# Patient Record
Sex: Male | Born: 1965 | Race: Black or African American | Hispanic: No | Marital: Married | Smoking: Current every day smoker
Health system: Southern US, Community
[De-identification: ages and names within clinical notes are randomized; demographics above are authoritative.]

## PROBLEM LIST (undated history)

## (undated) DIAGNOSIS — E119 Type 2 diabetes mellitus without complications: Secondary | ICD-10-CM

## (undated) DIAGNOSIS — I671 Cerebral aneurysm, nonruptured: Secondary | ICD-10-CM

## (undated) DIAGNOSIS — I10 Essential (primary) hypertension: Secondary | ICD-10-CM

## (undated) DIAGNOSIS — I2510 Atherosclerotic heart disease of native coronary artery without angina pectoris: Secondary | ICD-10-CM

## (undated) DIAGNOSIS — E785 Hyperlipidemia, unspecified: Secondary | ICD-10-CM

## (undated) DIAGNOSIS — N189 Chronic kidney disease, unspecified: Secondary | ICD-10-CM

## (undated) DIAGNOSIS — I219 Acute myocardial infarction, unspecified: Secondary | ICD-10-CM

## (undated) HISTORY — PX: CORONARY ANGIOPLASTY: SHX604

## (undated) HISTORY — PX: SHOULDER SURGERY: SHX246

---

## 2007-02-07 ENCOUNTER — Emergency Department: Payer: Self-pay | Admitting: Emergency Medicine

## 2009-04-03 ENCOUNTER — Emergency Department: Payer: Self-pay | Admitting: Emergency Medicine

## 2015-09-24 ENCOUNTER — Encounter: Payer: Self-pay | Admitting: Emergency Medicine

## 2015-09-24 ENCOUNTER — Inpatient Hospital Stay
Admission: EM | Admit: 2015-09-24 | Discharge: 2015-09-25 | DRG: 249 | Disposition: A | Discharge status: Home / Self Care | Payer: Medicaid Other | Attending: Internal Medicine | Admitting: Internal Medicine

## 2015-09-24 ENCOUNTER — Inpatient Hospital Stay
Admit: 2015-09-24 | Discharge: 2015-09-24 | Disposition: A | Discharge status: Home / Self Care | Payer: Medicaid Other | Attending: Cardiovascular Disease | Admitting: Cardiovascular Disease

## 2015-09-24 ENCOUNTER — Encounter
Admission: EM | Disposition: A | Discharge status: Home / Self Care | Payer: Self-pay | Source: Home / Self Care | Attending: Internal Medicine

## 2015-09-24 ENCOUNTER — Emergency Department: Payer: Medicaid Other

## 2015-09-24 DIAGNOSIS — Z833 Family history of diabetes mellitus: Secondary | ICD-10-CM

## 2015-09-24 DIAGNOSIS — F17210 Nicotine dependence, cigarettes, uncomplicated: Secondary | ICD-10-CM | POA: Diagnosis present

## 2015-09-24 DIAGNOSIS — I10 Essential (primary) hypertension: Secondary | ICD-10-CM | POA: Diagnosis present

## 2015-09-24 DIAGNOSIS — I214 Non-ST elevation (NSTEMI) myocardial infarction: Secondary | ICD-10-CM | POA: Diagnosis not present

## 2015-09-24 DIAGNOSIS — R7989 Other specified abnormal findings of blood chemistry: Secondary | ICD-10-CM

## 2015-09-24 DIAGNOSIS — R778 Other specified abnormalities of plasma proteins: Secondary | ICD-10-CM

## 2015-09-24 HISTORY — PX: CARDIAC CATHETERIZATION: SHX172

## 2015-09-24 HISTORY — DX: Cerebral aneurysm, nonruptured: I67.1

## 2015-09-24 LAB — HEPATIC FUNCTION PANEL
ALT: 27 U/L (ref 17–63)
AST: 75 U/L — AB (ref 15–41)
Albumin: 4.2 g/dL (ref 3.5–5.0)
Alkaline Phosphatase: 52 U/L (ref 38–126)
Bilirubin, Direct: <0.1 mg/dL — ABNORMAL LOW (ref 0.1–0.5)
TOTAL PROTEIN: 8.4 g/dL — AB (ref 6.5–8.1)
Total Bilirubin: 0.6 mg/dL (ref 0.3–1.2)

## 2015-09-24 LAB — URINE DRUG SCREEN, QUALITATIVE (ARMC ONLY)
Amphetamines, Ur Screen: NOT DETECTED
Barbiturates, Ur Screen: NOT DETECTED
Benzodiazepine, Ur Scrn: POSITIVE — AB
Cannabinoid 50 Ng, Ur ~~LOC~~: POSITIVE — AB
Cocaine Metabolite,Ur ~~LOC~~: POSITIVE — AB
MDMA (Ecstasy)Ur Screen: NOT DETECTED
Methadone Scn, Ur: NOT DETECTED
Opiate, Ur Screen: POSITIVE — AB
Phencyclidine (PCP) Ur S: NOT DETECTED
Tricyclic, Ur Screen: NOT DETECTED

## 2015-09-24 LAB — CBC
HCT: 43 % (ref 40.0–52.0)
HEMOGLOBIN: 14.5 g/dL (ref 13.0–18.0)
MCH: 29.8 pg (ref 26.0–34.0)
MCHC: 33.8 g/dL (ref 32.0–36.0)
MCV: 88.3 fL (ref 80.0–100.0)
Platelets: 312 10*3/uL (ref 150–440)
RBC: 4.87 MIL/uL (ref 4.40–5.90)
RDW: 14.1 % (ref 11.5–14.5)
WBC: 11.1 10*3/uL — ABNORMAL HIGH (ref 3.8–10.6)

## 2015-09-24 LAB — BASIC METABOLIC PANEL
ANION GAP: 9 (ref 5–15)
BUN: 13 mg/dL (ref 6–20)
CALCIUM: 9.5 mg/dL (ref 8.9–10.3)
CO2: 24 mmol/L (ref 22–32)
Chloride: 101 mmol/L (ref 101–111)
Creatinine, Ser: 1.25 mg/dL — ABNORMAL HIGH (ref 0.61–1.24)
Glucose, Bld: 215 mg/dL — ABNORMAL HIGH (ref 65–99)
Potassium: 3.9 mmol/L (ref 3.5–5.1)
Sodium: 134 mmol/L — ABNORMAL LOW (ref 135–145)

## 2015-09-24 LAB — CK: Total CK: 842 U/L — ABNORMAL HIGH (ref 49–397)

## 2015-09-24 LAB — HEMOGLOBIN A1C: Hgb A1c MFr Bld: 9.2 % — ABNORMAL HIGH (ref 4.0–6.0)

## 2015-09-24 LAB — ECHOCARDIOGRAM COMPLETE
HEIGHTINCHES: 66 in
WEIGHTICAEL: 2974.4 [oz_av]

## 2015-09-24 LAB — APTT: aPTT: 30 seconds (ref 24–36)

## 2015-09-24 LAB — PROTIME-INR
INR: 1.11
Prothrombin Time: 14.5 seconds (ref 11.4–15.0)

## 2015-09-24 LAB — TROPONIN I: TROPONIN I: 4.75 ng/mL — AB (ref <0.031)

## 2015-09-24 LAB — LIPASE, BLOOD: Lipase: 29 U/L (ref 11–51)

## 2015-09-24 LAB — TSH: TSH: 2.712 u[IU]/mL (ref 0.350–4.500)

## 2015-09-24 SURGERY — LEFT HEART CATH AND CORONARY ANGIOGRAPHY
Anesthesia: Moderate Sedation | Laterality: Right

## 2015-09-24 MED ORDER — SODIUM CHLORIDE 0.9 % IV SOLN
0.2500 mg/kg/h | INTRAVENOUS | Status: AC
  Filled 2015-09-24: qty 250

## 2015-09-24 MED ORDER — ACETAMINOPHEN 325 MG PO TABS: 650.0000 mg | ORAL_TABLET | Freq: Four times a day (QID) | ORAL | Status: DC | PRN

## 2015-09-24 MED ORDER — HEPARIN (PORCINE) IN NACL 2-0.9 UNIT/ML-% IJ SOLN
INTRAMUSCULAR | Status: AC
  Filled 2015-09-24: qty 1000

## 2015-09-24 MED ORDER — HEPARIN (PORCINE) IN NACL 100-0.45 UNIT/ML-% IJ SOLN
1000.0000 [IU]/h | INTRAMUSCULAR | Status: DC
  Administered 2015-09-24: 1000 [IU]/h via INTRAVENOUS
  Filled 2015-09-24: qty 250

## 2015-09-24 MED ORDER — MIDAZOLAM HCL 2 MG/2ML IJ SOLN
INTRAMUSCULAR | Status: AC
  Filled 2015-09-24: qty 2

## 2015-09-24 MED ORDER — ONDANSETRON HCL 4 MG/2ML IJ SOLN
4.0000 mg | Freq: Once | INTRAMUSCULAR | Status: AC
  Administered 2015-09-24: 4 mg via INTRAVENOUS
  Filled 2015-09-24: qty 2

## 2015-09-24 MED ORDER — ASPIRIN 81 MG PO CHEW
CHEWABLE_TABLET | ORAL | Status: DC | PRN
  Administered 2015-09-24: 324 mg via ORAL

## 2015-09-24 MED ORDER — MORPHINE SULFATE (PF) 4 MG/ML IV SOLN
4.0000 mg | Freq: Once | INTRAVENOUS | Status: AC
  Administered 2015-09-24: 4 mg via INTRAVENOUS
  Filled 2015-09-24: qty 1

## 2015-09-24 MED ORDER — BIVALIRUDIN 250 MG IV SOLR
INTRAVENOUS | Status: AC
  Filled 2015-09-24: qty 250

## 2015-09-24 MED ORDER — ACETAMINOPHEN 650 MG RE SUPP: 650.0000 mg | Freq: Four times a day (QID) | RECTAL | Status: DC | PRN

## 2015-09-24 MED ORDER — ONDANSETRON HCL 4 MG/2ML IJ SOLN: 4.0000 mg | Freq: Four times a day (QID) | INTRAMUSCULAR | Status: DC | PRN

## 2015-09-24 MED ORDER — BIVALIRUDIN BOLUS VIA INFUSION - CUPID
INTRAVENOUS | Status: DC | PRN
  Administered 2015-09-24: 63.225 mg via INTRAVENOUS

## 2015-09-24 MED ORDER — CLINDAMYCIN PHOSPHATE 300 MG/50ML IV SOLN
INTRAVENOUS | Status: AC
  Filled 2015-09-24: qty 50

## 2015-09-24 MED ORDER — DOCUSATE SODIUM 100 MG PO CAPS
100.0000 mg | ORAL_CAPSULE | Freq: Two times a day (BID) | ORAL | Status: DC
  Administered 2015-09-25: 100 mg via ORAL
  Filled 2015-09-24 (×2): qty 1

## 2015-09-24 MED ORDER — MIDAZOLAM HCL 2 MG/2ML IJ SOLN
INTRAMUSCULAR | Status: DC | PRN
  Administered 2015-09-24: 1 mg via INTRAVENOUS

## 2015-09-24 MED ORDER — TICAGRELOR 90 MG PO TABS
ORAL_TABLET | ORAL | Status: DC | PRN
  Administered 2015-09-24: 180 mg via ORAL

## 2015-09-24 MED ORDER — FENTANYL CITRATE (PF) 100 MCG/2ML IJ SOLN
INTRAMUSCULAR | Status: DC | PRN
  Administered 2015-09-24: 25 ug via INTRAVENOUS

## 2015-09-24 MED ORDER — IOPAMIDOL (ISOVUE-300) INJECTION 61%
INTRAVENOUS | Status: DC | PRN
  Administered 2015-09-24: 240 mL via INTRA_ARTERIAL

## 2015-09-24 MED ORDER — BIVALIRUDIN 250 MG IV SOLR
250.0000 mg | INTRAVENOUS | Status: DC | PRN
  Administered 2015-09-24: 1.75 mg/kg/h via INTRAVENOUS

## 2015-09-24 MED ORDER — HEPARIN SODIUM (PORCINE) 5000 UNIT/ML IJ SOLN
60.0000 [IU]/kg | Freq: Once | INTRAMUSCULAR | Status: DC
  Filled 2015-09-24: qty 2

## 2015-09-24 MED ORDER — HEPARIN (PORCINE) IN NACL 100-0.45 UNIT/ML-% IJ SOLN: 14.0000 [IU]/kg/h | Freq: Once | INTRAMUSCULAR | Status: DC

## 2015-09-24 MED ORDER — ASPIRIN 81 MG PO CHEW
CHEWABLE_TABLET | ORAL | Status: AC
  Filled 2015-09-24: qty 4

## 2015-09-24 MED ORDER — ASPIRIN EC 81 MG PO TBEC
81.0000 mg | DELAYED_RELEASE_TABLET | Freq: Every day | ORAL | Status: DC
  Administered 2015-09-25: 81 mg via ORAL
  Filled 2015-09-24: qty 1

## 2015-09-24 MED ORDER — ONDANSETRON HCL 4 MG PO TABS: 4.0000 mg | ORAL_TABLET | Freq: Four times a day (QID) | ORAL | Status: DC | PRN

## 2015-09-24 MED ORDER — TICAGRELOR 90 MG PO TABS
ORAL_TABLET | ORAL | Status: AC
  Filled 2015-09-24: qty 2

## 2015-09-24 MED ORDER — FENTANYL CITRATE (PF) 100 MCG/2ML IJ SOLN
INTRAMUSCULAR | Status: DC | PRN
  Administered 2015-09-24: 50 ug via INTRAVENOUS

## 2015-09-24 MED ORDER — FENTANYL CITRATE (PF) 100 MCG/2ML IJ SOLN
INTRAMUSCULAR | Status: AC
  Filled 2015-09-24: qty 2

## 2015-09-24 MED ORDER — OXYCODONE HCL 5 MG PO TABS
5.0000 mg | ORAL_TABLET | ORAL | Status: DC | PRN
  Administered 2015-09-24 – 2015-09-25 (×3): 5 mg via ORAL
  Filled 2015-09-24 (×3): qty 1

## 2015-09-24 MED ORDER — ASPIRIN 81 MG PO CHEW
324.0000 mg | CHEWABLE_TABLET | Freq: Once | ORAL | Status: AC
  Administered 2015-09-24: 324 mg via ORAL
  Filled 2015-09-24: qty 4

## 2015-09-24 MED ORDER — MIDAZOLAM HCL 2 MG/2ML IJ SOLN
INTRAMUSCULAR | Status: DC | PRN
  Administered 2015-09-24: 0.5 mg via INTRAVENOUS

## 2015-09-24 MED ORDER — SODIUM CHLORIDE 0.9% FLUSH
3.0000 mL | Freq: Two times a day (BID) | INTRAVENOUS | Status: DC
  Administered 2015-09-24: 3 mL via INTRAVENOUS

## 2015-09-24 MED ORDER — MORPHINE SULFATE (PF) 2 MG/ML IV SOLN
2.0000 mg | INTRAVENOUS | Status: DC | PRN
  Administered 2015-09-24: 2 mg via INTRAVENOUS
  Filled 2015-09-24: qty 1

## 2015-09-24 MED ORDER — NITROGLYCERIN 2 % TD OINT
1.0000 [in_us] | TOPICAL_OINTMENT | Freq: Once | TRANSDERMAL | Status: AC
  Administered 2015-09-24: 1 [in_us] via TOPICAL
  Filled 2015-09-24: qty 1

## 2015-09-24 MED ORDER — HEPARIN BOLUS VIA INFUSION
4000.0000 [IU] | Freq: Once | INTRAVENOUS | Status: AC
  Administered 2015-09-24: 4000 [IU] via INTRAVENOUS
  Filled 2015-09-24: qty 4000

## 2015-09-24 MED ORDER — ATORVASTATIN CALCIUM 20 MG PO TABS
80.0000 mg | ORAL_TABLET | Freq: Every day | ORAL | Status: DC
  Administered 2015-09-24: 80 mg via ORAL
  Filled 2015-09-24: qty 4

## 2015-09-24 MED ORDER — SODIUM CHLORIDE 0.9 % IV SOLN
INTRAVENOUS | Status: DC
  Administered 2015-09-24 – 2015-09-25 (×4): via INTRAVENOUS

## 2015-09-24 MED ORDER — NITROGLYCERIN 5 MG/ML IV SOLN
INTRAVENOUS | Status: AC
  Filled 2015-09-24: qty 10

## 2015-09-24 SURGICAL SUPPLY — 16 items
BALLN TREK RX 2.5X15 (BALLOONS) ×3
BALLOON TREK RX 2.5X15 (BALLOONS) ×2 IMPLANT
CATH INFINITI 5FR ANG PIGTAIL (CATHETERS) ×3 IMPLANT
CATH INFINITI 5FR JL4 (CATHETERS) ×3 IMPLANT
CATH INFINITI JR4 5F (CATHETERS) ×3 IMPLANT
CATH VISTA GUIDE 6FR JR4 (CATHETERS) ×3 IMPLANT
DEVICE CLOSURE MYNXGRIP 6/7F (Vascular Products) ×3 IMPLANT
DEVICE INFLAT 30 PLUS (MISCELLANEOUS) ×3 IMPLANT
KIT MANI 3VAL PERCEP (MISCELLANEOUS) ×3 IMPLANT
NEEDLE PERC 18GX7CM (NEEDLE) ×3 IMPLANT
PACK CARDIAC CATH (CUSTOM PROCEDURE TRAY) ×3 IMPLANT
SHEATH AVANTI 6FR X 11CM (SHEATH) ×3 IMPLANT
SHEATH PINNACLE 5F 10CM (SHEATH) ×3 IMPLANT
STENT VISION RX 2.75X18 (Permanent Stent) ×3 IMPLANT
WIRE EMERALD 3MM-J .035X150CM (WIRE) ×3 IMPLANT
WIRE G HI TQ BMW 190 (WIRE) ×3 IMPLANT

## 2015-09-24 NOTE — ED Notes (Signed)
Dr Dolores FrameSung notified of pt's troponin results

## 2015-09-24 NOTE — ED Notes (Signed)
Dr. Diamond in to see pt.  

## 2015-09-24 NOTE — H&P (Signed)
Tyler Morales is an 50 y.o. male.   Chief Complaint: Chest pain HPI: The patient with no past medical history presents emergency department with chest pain. He states that it began almost 24 hours ago and has been constant. He admits to associated shortness of breath and nausea. The pain is located under his left breast. It does not radiate. He has never had pain like this before. He admits to smoking an occasional social drinking. He also admits to cocaine use 1 month ago. In the emergency department, the patient's blood pressure was elevated and Nitropaste was applied to his chest. Troponin was found to be elevated and he was started on a heparin drip before being admitted to the hospitalist service for further evaluation.   Past Medical History  Diagnosis Date  . Cerebral aneurysm without rupture     Past Surgical History  Procedure Laterality Date  . Shoulder surgery Left     Family History  Problem Relation Age of Onset  . Diabetes Mellitus II     Social History:  reports that he has been smoking Cigarettes.  He has been smoking about 0.50 packs per day. He does not have any smokeless tobacco history on file. He reports that he does not drink alcohol or use illicit drugs.  Allergies: No Known Allergies  Prior to Admission medications   Not on File   None  Results for orders placed or performed during the hospital encounter of 09/24/15 (from the past 48 hour(s))  Basic metabolic panel     Status: Abnormal   Collection Time: 09/24/15  3:50 AM  Result Value Ref Range   Sodium 134 (L) 135 - 145 mmol/L   Potassium 3.9 3.5 - 5.1 mmol/L   Chloride 101 101 - 111 mmol/L   CO2 24 22 - 32 mmol/L   Glucose, Bld 215 (H) 65 - 99 mg/dL   BUN 13 6 - 20 mg/dL   Creatinine, Ser 1.25 (H) 0.61 - 1.24 mg/dL   Calcium 9.5 8.9 - 10.3 mg/dL   GFR calc non Af Amer >60 >60 mL/min   GFR calc Af Amer >60 >60 mL/min    Comment: (NOTE) The eGFR has been calculated using the CKD EPI  equation. This calculation has not been validated in all clinical situations. eGFR's persistently <60 mL/min signify possible Chronic Kidney Disease.    Anion gap 9 5 - 15  CBC     Status: Abnormal   Collection Time: 09/24/15  3:50 AM  Result Value Ref Range   WBC 11.1 (H) 3.8 - 10.6 K/uL   RBC 4.87 4.40 - 5.90 MIL/uL   Hemoglobin 14.5 13.0 - 18.0 g/dL   HCT 43.0 40.0 - 52.0 %   MCV 88.3 80.0 - 100.0 fL   MCH 29.8 26.0 - 34.0 pg   MCHC 33.8 32.0 - 36.0 g/dL   RDW 14.1 11.5 - 14.5 %   Platelets 312 150 - 440 K/uL  Troponin I     Status: Abnormal   Collection Time: 09/24/15  3:50 AM  Result Value Ref Range   Troponin I 4.75 (H) <0.031 ng/mL    Comment: READ BACK AND VERIFIED WITH LISA THOMPSON @ [phone] ON 09/24/2015 BY [facility]        POSSIBLE MYOCARDIAL ISCHEMIA. SERIAL TESTING RECOMMENDED.   Hepatic function panel     Status: Abnormal   Collection Time: 09/24/15  3:50 AM  Result Value Ref Range   Total Protein 8.4 (H) 6.5 - 8.1 g/dL  Albumin 4.2 3.5 - 5.0 g/dL   AST 75 (H) 15 - 41 U/L   ALT 27 17 - 63 U/L   Alkaline Phosphatase 52 38 - 126 U/L   Total Bilirubin 0.6 0.3 - 1.2 mg/dL   Bilirubin, Direct <0.1 (L) 0.1 - 0.5 mg/dL   Indirect Bilirubin NOT CALCULATED 0.3 - 0.9 mg/dL  Lipase, blood     Status: None   Collection Time: 09/24/15  3:50 AM  Result Value Ref Range   Lipase 29 11 - 51 U/L  CK     Status: Abnormal   Collection Time: 09/24/15  3:50 AM  Result Value Ref Range   Total CK 842 (H) 49 - 397 U/L   Dg Chest Portable 1 View  09/24/2015  CLINICAL DATA:  Chest pain, non radiating EXAM: PORTABLE CHEST 1 VIEW COMPARISON:  None available FINDINGS: Normal heart size and mediastinal contours. No infiltrate or edema. No effusion or pneumothorax. No acute osseous findings. IMPRESSION: Negative portable chest. Electronically Signed   By: Monte Fantasia M.D.   On: 09/24/2015 04:11    Review of Systems  Constitutional: Negative for fever and chills.  HENT: Negative for  sore throat and tinnitus.   Eyes: Negative for blurred vision and redness.  Respiratory: Positive for shortness of breath. Negative for cough.   Cardiovascular: Positive for chest pain. Negative for palpitations, orthopnea and PND.  Gastrointestinal: Positive for nausea. Negative for vomiting, abdominal pain and diarrhea.  Genitourinary: Negative for dysuria, urgency and frequency.  Musculoskeletal: Negative for myalgias and joint pain.  Skin: Negative for rash.       No lesions  Neurological: Negative for speech change, focal weakness and weakness.  Endo/Heme/Allergies: Does not bruise/bleed easily.       No temperature intolerance  Psychiatric/Behavioral: Negative for depression and suicidal ideas.    Blood pressure 154/98, pulse 77, temperature 98.6 F (37 C), temperature source Oral, resp. rate 19, height '5\' 6"'$  (1.676 m), weight 89.812 kg (198 lb), SpO2 96 %. Physical Exam  Nursing note and vitals reviewed. Constitutional: He is oriented to person, place, and time. He appears well-developed and well-nourished. No distress.  HENT:  Head: Normocephalic and atraumatic.  Mouth/Throat: Oropharynx is clear and moist.  Eyes: Conjunctivae and EOM are normal. Pupils are equal, round, and reactive to light. No scleral icterus.  Neck: Normal range of motion. Neck supple. No JVD present. No tracheal deviation present. No thyromegaly present.  Cardiovascular: Normal rate, regular rhythm and normal heart sounds.  Exam reveals no gallop and no friction rub.   No murmur heard. Respiratory: Effort normal and breath sounds normal. No respiratory distress. He has no wheezes.  GI: Soft. Bowel sounds are normal. He exhibits no distension. There is no tenderness.  Genitourinary:  Deferred  Musculoskeletal: Normal range of motion. He exhibits no edema.  Lymphadenopathy:    He has no cervical adenopathy.  Neurological: He is alert and oriented to person, place, and time. No cranial nerve deficit.   Skin: Skin is warm and dry. No rash noted. No erythema.  Psychiatric: He has a normal mood and affect. His behavior is normal. Judgment and thought content normal.     Assessment/Plan This is a 50 year old male admitted for NSTEMI. 1. NSTEMI: Ongoing chest pain and elevated troponin. EKG without signs of ischemia. Possible pain ingestion. Urine toxicology pending. We'll avoid beta blockers for now. Optimize blood pressure to decrease myocardial oxygen demand. Heparin drip. Continue to monitor telemetry and cycle cardiac enzymes. Cardiology  consulted. 2. DVT prophylaxis: Therapeutic anticoagulation as above 3. GI prophylaxis: H2 blocker as needed. The patient is a full code. Time spent on admission orders and patient care approximately 45 minutes  Harrie Foreman, MD 09/24/2015, 5:39 AM

## 2015-09-24 NOTE — Progress Notes (Signed)
ANTICOAGULATION CONSULT NOTE - Initial Consult  Pharmacy Consult for heparin Indication: chest pain/ACS  No Known Allergies  Patient Measurements: Height: 5\' 6"  (167.6 cm) Weight: 198 lb (89.812 kg) IBW/kg (Calculated) : 63.8 Heparin Dosing Weight: 82.8 kg  Vital Signs: Temp: 98.6 F (37 C) (06/06 0342) Temp Source: Oral (06/06 0342) BP: 159/113 mmHg (06/06 0400) Pulse Rate: 72 (06/06 0342)  Labs:  Recent Labs  09/24/15 0350  HGB 14.5  HCT 43.0  PLT 312    CrCl cannot be calculated (Patient has no serum creatinine result on file.).   Medical History: History reviewed. No pertinent past medical history.  Medications:  Infusions:  . heparin      Assessment: 50 yom cc CP nonradiating, with nausea. History of cocaine use. Pharmacy consulted to dose heparin for ACS.  Goal of Therapy:  Heparin level 0.3-0.7 units/ml Monitor platelets by anticoagulation protocol: Yes   Plan:  Give 4000 units bolus x 1 Start heparin infusion at 1000 units/hr Check anti-Xa level in 6 hours and daily while on heparin Continue to monitor H&H and platelets  Carola FrostNathan A Eulon Allnutt, Pharm.D., BCPS Clinical Pharmacist 09/24/2015,4:34 AM

## 2015-09-24 NOTE — ED Notes (Addendum)
Pt ambulatory to room 8 without difficulty or distress noted; pt c/o left upper CP, nonradiating accomp by nausea since yesterday; denies hx of same; resp even/unlab, lungs clear, +BS, abd soft/nondist/nontender; apical audible & regular; pt placed in hosp gown and on card monitor

## 2015-09-24 NOTE — ED Notes (Signed)
PCXR completed as ordered 

## 2015-09-24 NOTE — Progress Notes (Signed)
Per verbal order from Dr. Welton FlakesKhan d/c heparin drip. Orders placed. Trudee KusterBrandi R Mansfield

## 2015-09-24 NOTE — Progress Notes (Signed)
Report to brandy telemetry  Check right groin for bleeding or hematoma.  Patient will be on bedrest for 2 hours post sheath pull---out of bed at 14:40.  Bilateral pulses are 2's DP's..Marland Kitchen

## 2015-09-24 NOTE — Progress Notes (Signed)
Inpatient Diabetes Program Recommendations  AACE/ADA: New Consensus Statement on Inpatient Glycemic Control (2015)  Target Ranges:  Prepandial:   less than 140 mg/dL      Peak postprandial:   less than 180 mg/dL (1-2 hours)      Critically ill patients:  140 - 180 mg/dL     Review of Glycemic Control Results for [name], Tyler Morales (MRN [id number]) as of 09/24/2015 12:39  Ref. Range 09/24/2015 03:50  Glucose Latest Ref Range: 65-99 mg/dL 841215 (H)    Diabetes history: None noted Outpatient Diabetes medications: None  Inpatient Diabetes Program Recommendations:    A1C pending. Consider checking blood sugars tid with meals and HS with Novolog correction while patient is in the hospital.  Will follow.  Thanks, Beryl MeagerJenny Alquan Morrish, RN, BC-ADM Inpatient Diabetes Coordinator Pager [phone] (8a-5p)

## 2015-09-24 NOTE — Progress Notes (Signed)
[facility] [place] [facility]   PATIENT NAME: Tyler ChiquitoKeith Morales    MRN#:  [id number]  DATE OF BIRTH:  [birth year]  SUBJECTIVE:  Hospital Day: 0 days Tyler Morales is a 50 y.o. male presenting with Chest Pain .   Overnight events: No overnight events Interval Events: Continued intermittent chest pain though relieved with pain medication  REVIEW OF SYSTEMS:  CONSTITUTIONAL: No fever, fatigue or weakness.  EYES: No blurred or double vision.  EARS, NOSE, AND THROAT: No tinnitus or ear pain.  RESPIRATORY: No cough, shortness of breath, wheezing or hemoptysis.  CARDIOVASCULAR: Positive chest pain, denies orthopnea, edema.  GASTROINTESTINAL: No nausea, vomiting, diarrhea or abdominal pain.  GENITOURINARY: No dysuria, hematuria.  ENDOCRINE: No polyuria, nocturia,  HEMATOLOGY: No anemia, easy bruising or bleeding SKIN: No rash or lesion. MUSCULOSKELETAL: No joint pain or arthritis.   NEUROLOGIC: No tingling, numbness, weakness.  PSYCHIATRY: No anxiety or depression.   DRUG ALLERGIES:  No Known Allergies  VITALS:  Blood pressure 150/105, pulse 89, temperature 98.2 F (36.8 C), temperature source Oral, resp. rate 20, height 5\' 6"  (1.676 m), weight 185 lb 14.4 oz (84.324 kg), SpO2 99 %.  PHYSICAL EXAMINATION:  VITAL SIGNS: Filed Vitals:   09/24/15 1110 09/24/15 1245  BP: 147/108 150/105  Pulse: 103 89  Temp: 98.2 F (36.8 C)   Resp: 18 20   GENERAL:50 y.o.male currently in no acute distress.  HEAD: Normocephalic, atraumatic.  EYES: Pupils equal, round, reactive to light. Extraocular muscles intact. No scleral icterus.  MOUTH: Moist mucosal membrane. Dentition intact. No abscess noted.  EAR, NOSE, THROAT: Clear without exudates. No external lesions.  NECK: Supple. No thyromegaly. No nodules. No JVD.  PULMONARY: Clear to ascultation, without wheeze rails or rhonci. No use of accessory muscles, Good respiratory effort. good air entry  bilaterally CHEST: Nontender to palpation.  CARDIOVASCULAR: S1 and S2. Regular rate and rhythm. No murmurs, rubs, or gallops. No edema. Pedal pulses 2+ bilaterally.  GASTROINTESTINAL: Soft, nontender, nondistended. No masses. Positive bowel sounds. No hepatosplenomegaly.  MUSCULOSKELETAL: No swelling, clubbing, or edema. Range of motion full in all extremities.  NEUROLOGIC: Cranial nerves II through XII are intact. No gross focal neurological deficits. Sensation intact. Reflexes intact.  SKIN: No ulceration, lesions, rashes, or cyanosis. Skin warm and dry. Turgor intact.  PSYCHIATRIC: Mood, affect within normal limits. The patient is awake, alert and oriented x 3. Insight, judgment intact.      LABORATORY PANEL:   CBC  Recent Labs Lab 09/24/15 0350  WBC 11.1*  HGB 14.5  HCT 43.0  PLT 312   ------------------------------------------------------------------------------------------------------------------  Chemistries   Recent Labs Lab 09/24/15 0350  NA 134*  K 3.9  CL 101  CO2 24  GLUCOSE 215*  BUN 13  CREATININE 1.25*  CALCIUM 9.5  AST 75*  ALT 27  ALKPHOS 52  BILITOT 0.6   ------------------------------------------------------------------------------------------------------------------  Cardiac Enzymes  Recent Labs Lab 09/24/15 0350  TROPONINI 4.75*   ------------------------------------------------------------------------------------------------------------------  RADIOLOGY:  Dg Chest Portable 1 View  09/24/2015  CLINICAL DATA:  Chest pain, non radiating EXAM: PORTABLE CHEST 1 VIEW COMPARISON:  None available FINDINGS: Normal heart size and mediastinal contours. No infiltrate or edema. No effusion or pneumothorax. No acute osseous findings. IMPRESSION: Negative portable chest. Electronically Signed   By: Marnee SpringJonathon  Watts M.D.   On: 09/24/2015 04:11    EKG:   Orders placed or performed during the hospital encounter of 09/24/15  . ED EKG within 10 minutes   .  ED EKG within 10 minutes  . EKG 12-Lead  . EKG 12-Lead    ASSESSMENT AND PLAN:   Tyler Morales is a 50 y.o. male presenting with Chest Pain . Admitted 09/24/2015 : Day #: 0 days 1. NSTEMI: Aspirin, statin, heparin, cardiology consult, telemetry, cardiac enzymes, echocardiogram, patient for cardiac catheterization    All the records are reviewed and case discussed with Care Management/Social Workerr. Management plans discussed with the patient, family and they are in agreement.  CODE STATUS: full TOTAL TIME TAKING CARE OF THIS PATIENT: 28 minutes.   POSSIBLE D/C IN 1-2DAYS, DEPENDING ON CLINICAL CONDITION.   Hower,  Tyler Morales.D on 09/24/2015 at 1:00 PM  Between 7am to 6pm - Pager - [phone]  After 6pm: House Pager: - [phone]  [facility]  Office  [phone]  CC: Primary care physician; No PCP Per Patient

## 2015-09-24 NOTE — Progress Notes (Signed)
Patient back from cath, all vss. R groin dressing clean, dry, and intact. No signs of hematoma. Positive pulses. Trudee KusterBrandi R Mansfield

## 2015-09-24 NOTE — Progress Notes (Signed)
Tyler Morales is Morales 50 y.o. 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 Chest pain is described as sharp associated with shortness of breath and dizziness.   Review of Systems: No orthopnea PND or leg swelling   Past Medical History  Diagnosis Date  . Cerebral aneurysm without rupture     No prescriptions prior to admission     . docusate sodium  100 mg Oral BID  . sodium chloride flush  3 mL Intravenous Q12H    Infusions: . sodium chloride 125 mL/hr at 09/24/15 0639  . heparin 1,000 Units/hr (09/24/15 0502)    No Known Allergies  Social History   Social History  . Marital Status: Single    Spouse Name: N/Morales  . Number of Children: N/Morales  . Years of Education: N/Morales   Occupational History  . Not on file.   Social History Main Topics  . Smoking status: Current Every Day Smoker -- 0.50 packs/day    Types: Cigarettes  . Smokeless tobacco: Not on file  . Alcohol Use: No     Comment: weekly  . Drug Use: No  . Sexual Activity: Not on file   Other Topics Concern  . Not on file   Social History Narrative  . No narrative on file    Family History  Problem Relation Age of Onset  . Diabetes Mellitus II      PHYSICAL EXAM: Filed Vitals:   09/24/15 0600 09/24/15 0627  BP: 154/85 152/96  Pulse: 91 83  Temp:  98 F (36.7 C)  Resp: 23 19    No intake or output data in the 24 hours ending 09/24/15 0849  General:  Well appearing. No respiratory difficulty HEENT: normal Neck: supple. no JVD. Carotids 2+ bilat; no bruits. No lymphadenopathy or thryomegaly appreciated. Cor: PMI nondisplaced. Regular rate &  rhythm. No rubs, gallops or murmurs. Lungs: clear Abdomen: soft, nontender, nondistended. No hepatosplenomegaly. No bruits or masses. Good bowel sounds. Extremities: no cyanosis, clubbing, rash, edema Neuro: alert & oriented x 3, cranial nerves grossly intact. moves all 4 extremities w/o difficulty. Affect pleasant.  WUJ:WJXBJECG:Sinus rhythm no ST T changes  Results for orders placed or performed during the hospital encounter of 09/24/15 (from the past 24 hour(s))  Basic metabolic panel     Status: Abnormal   Collection Time: 09/24/15  3:50 AM  Result Value Ref Range   Sodium 134 (L) 135 - 145 mmol/L   Potassium 3.9 3.5 - 5.1 mmol/L   Chloride 101 101 - 111 mmol/L   CO2 24 22 - 32 mmol/L   Glucose, Bld 215 (H) 65 - 99 mg/dL   BUN 13 6 - 20 mg/dL   Creatinine, Ser 4.781.25 (H) 0.61 - 1.24 mg/dL   Calcium 9.5 8.9 - 29.510.3 mg/dL   GFR calc non Af Amer >60 >60 mL/min   GFR calc Af Amer >60 >60 mL/min   Anion gap 9 5 - 15  CBC     Status: Abnormal   Collection Time: 09/24/15  3:50 AM  Result Value Ref Range   WBC 11.1 (H) 3.8 - 10.6 K/uL   RBC 4.87  4.40 - 5.90 MIL/uL   Hemoglobin 14.5 13.0 - 18.0 g/dL   HCT 66.4 40.3 - 47.4 %   MCV 88.3 80.0 - 100.0 fL   MCH 29.8 26.0 - 34.0 pg   MCHC 33.8 32.0 - 36.0 g/dL   RDW 25.9 56.3 - 87.5 %   Platelets 312 150 - 440 K/uL  Troponin I     Status: Abnormal   Collection Time: 09/24/15  3:50 AM  Result Value Ref Range   Troponin I 4.75 (H) <0.031 ng/mL  Hepatic function panel     Status: Abnormal   Collection Time: 09/24/15  3:50 AM  Result Value Ref Range   Total Protein 8.4 (H) 6.5 - 8.1 g/dL   Albumin 4.2 3.5 - 5.0 g/dL   AST 75 (H) 15 - 41 U/L   ALT 27 17 - 63 U/L   Alkaline Phosphatase 52 38 - 126 U/L   Total Bilirubin 0.6 0.3 - 1.2 mg/dL   Bilirubin, Direct <6.4 (L) 0.1 - 0.5 mg/dL   Indirect Bilirubin NOT CALCULATED 0.3 - 0.9 mg/dL  Lipase, blood     Status: None   Collection Time: 09/24/15  3:50 AM  Result Value Ref Range   Lipase 29 11 -  51 U/L  CK     Status: Abnormal   Collection Time: 09/24/15  3:50 AM  Result Value Ref Range   Total CK 842 (H) 49 - 397 U/L  TSH     Status: None   Collection Time: 09/24/15  3:50 AM  Result Value Ref Range   TSH 2.712 0.350 - 4.500 uIU/mL  APTT     Status: None   Collection Time: 09/24/15  4:50 AM  Result Value Ref Range   aPTT 30 24 - 36 seconds  Protime-INR     Status: None   Collection Time: 09/24/15  4:50 AM  Result Value Ref Range   Prothrombin Time 14.5 11.4 - 15.0 seconds   INR 1.11    Dg Chest Portable 1 View  09/24/2015  CLINICAL DATA:  Chest pain, non radiating EXAM: PORTABLE CHEST 1 VIEW COMPARISON:  None available FINDINGS: Normal heart size and mediastinal contours. No infiltrate or edema. No effusion or pneumothorax. No acute osseous findings. IMPRESSION: Negative portable chest. Electronically Signed   By: Marnee Spring M.D.   On: 09/24/2015 04:11     ASSESSMENT AND PLAN: Non-STEMI with troponin over 4 and continues to have intermittent chest pain for 24 hours. Will do cardiac catheterization today. Patient is states that he's been having chest pain since yesterday and continues to have chest pain intermittently.  Tyler Morales

## 2015-09-24 NOTE — ED Provider Notes (Signed)
[facility] Emergency Department Provider Note   ____________________________________________  Time seen: Approximately 4:13 AM  I have reviewed the triage vital signs and the nursing notes.   HISTORY  Chief Complaint Chest Pain    HPI Tyler Morales is a 50 y.o. male or sensory the ED from home with a chief complaint of chest pain. Patient reports left upper chest pain onset at 10 AM on 09/23/2015 and has been constant. Describes nonradiating pain associated with nausea only. Denies associated diaphoresis, shortness of breath, vomiting, dizziness or palpitations. 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 Denies recent fever, chills, cough, congestion, abdominal pain, diarrhea. Denies recent travel, trauma or hormone use. Nothing makes his symptoms better or worse.   Past medical history None   There are no active problems to display for this patient.   Past Surgical History  Procedure Laterality Date  . Shoulder surgery Left     No current outpatient prescriptions on file.  Allergies Review of patient's allergies indicates no known allergies.  Family History None for CAD   Social History Social History  Substance Use Topics  . Smoking status: Current Every Day Smoker -- 0.50 packs/day    Types: Cigarettes  . Smokeless tobacco: None  . Alcohol Use: No     Comment: weekly  Denies illicit drug use  Review of Systems  Constitutional: No fever/chills. Eyes: No visual changes. ENT: No sore throat. Cardiovascular: Positive for chest pain. Respiratory: Denies shortness of breath. Gastrointestinal: No abdominal pain.  Positive for nausea, no vomiting.  No diarrhea.  No constipation. Genitourinary: Negative for dysuria. Musculoskeletal: Negative for back pain. Skin: Negative for rash. Neurological: Negative for headaches, focal weakness or numbness.  10-point ROS otherwise  negative.  ____________________________________________   PHYSICAL EXAM:  VITAL SIGNS: ED Triage Vitals  Enc Vitals Group     BP 09/24/15 0342 177/110 mmHg     Pulse Rate 09/24/15 0342 72     Resp 09/24/15 0342 18     Temp 09/24/15 0342 98.6 F (37 C)     Temp Source 09/24/15 0342 Oral     SpO2 09/24/15 0342 98 %     Weight 09/24/15 0342 198 lb (89.812 kg)     Height 09/24/15 0342 5\' 6"  (1.676 m)     Head Cir --      Peak Flow --      Pain Score 09/24/15 0339 8     Pain Loc --      Pain Edu? --      Excl. in GC? --     Constitutional: Alert and oriented. Well appearing and in mild acute distress. Eyes: Conjunctivae are normal. PERRL. EOMI. Head: Atraumatic. Nose: No congestion/rhinnorhea. Mouth/Throat: Mucous membranes are moist.  Oropharynx non-erythematous. Neck: No stridor.   Cardiovascular: Normal rate, regular rhythm. Grossly normal heart sounds.  Good peripheral circulation. Respiratory: Normal respiratory effort.  No retractions. Lungs CTAB. Gastrointestinal: Soft and mildly tender to epigastrium without rebound or guarding. No distention. No abdominal bruits. No CVA tenderness. Musculoskeletal: No lower extremity tenderness nor edema.  No joint effusions. Neurologic:  Normal speech and language. No gross focal neurologic deficits are appreciated. No gait instability. Skin:  Skin is warm, dry and intact. No rash noted. Psychiatric: Mood and affect are normal. Speech and behavior are normal.  ____________________________________________   LABS (all labs ordered are listed, but only abnormal results are displayed)  Labs Reviewed  CBC - Abnormal; Notable  for the following:    WBC 11.1 (*)    All other components within normal limits  BASIC METABOLIC PANEL  TROPONIN I  HEPATIC FUNCTION PANEL  LIPASE, BLOOD  CK  URINE DRUG SCREEN, QUALITATIVE ([facility] ONLY)   ____________________________________________  EKG  ED ECG REPORT I, SUNG,JADE J, the attending  physician, personally viewed and interpreted this ECG.   Date: 09/24/2015  EKG Time: 0340  Rate: 67  Rhythm: normal EKG, normal sinus rhythm  Axis: Normal  Intervals:none  ST&T Change: Nonspecific  ____________________________________________  RADIOLOGY  Portable chest x-ray (viewed by me, interpreted by Dr. Grace Isaac): Negative portable chest. ____________________________________________   PROCEDURES  Procedure(s) performed: None  Critical Care performed: Yes, see critical care note(s)   CRITICAL CARE Performed by: Irean Hong   Total critical care time: 30 minutes  Critical care time was exclusive of separately billable procedures and treating other patients.  Critical care was necessary to treat or prevent imminent or life-threatening deterioration.  Critical care was time spent personally by me on the following activities: development of treatment plan with patient and/or surrogate as well as nursing, discussions with consultants, evaluation of patient's response to treatment, examination of patient, obtaining history from patient or surrogate, ordering and performing treatments and interventions, ordering and review of laboratory studies, ordering and review of radiographic studies, pulse oximetry and re-evaluation of patient's condition.  ____________________________________________   INITIAL IMPRESSION / ASSESSMENT AND PLAN / ED COURSE  Pertinent labs & imaging results that were available during my care of the patient were reviewed by me and considered in my medical decision making (see chart for details).  50 year old male who presents with ongoing chest pain 2 days. Will obtain screening lab work including troponin and CK given patient's occupation; will start with morphine and Zofran for patient's symptoms.  ----------------------------------------- 4:31 AM on 09/24/2015 -----------------------------------------  Updated patient of elevated troponin of 4.7.  Asked again regarding drug use. Now patient admits to frequent marijuana use. Admits to cocaine use; although he states he has not used cocaine in one month. Will administer aspirin, nitroglycerin and initiate heparin bolus with drip. Discussed with hospitalist to evaluate patient in the emergency department for admission. ____________________________________________   FINAL CLINICAL IMPRESSION(S) / ED DIAGNOSES  Final diagnoses:  Non-STEMI (non-ST elevated myocardial infarction) (HCC)  Elevated troponin  Essential hypertension      NEW MEDICATIONS STARTED DURING THIS VISIT:  New Prescriptions   No medications on file     Note:  This document was prepared using Dragon voice recognition software and may include unintentional dictation errors.    Irean Hong, MD 09/24/15 [phone]

## 2015-09-24 NOTE — ED Notes (Addendum)
Dr Dolores FrameSung in to discuss plan of care; pt admits to cocaine use (last approx month ago)

## 2015-09-24 NOTE — Progress Notes (Signed)
Patient had occluded distal right coronary for which PCI and stenting was done successfully. Patient does have moderate disease in distal left circumflex but the vessel is very small and mild disease in distal LAD and normal ejection fraction. Patient can go home tomorrow. With follow-up in the office.

## 2015-09-25 MED ORDER — ATORVASTATIN CALCIUM 80 MG PO TABS: 80.0000 mg | ORAL_TABLET | Freq: Every day | ORAL | Status: AC

## 2015-09-25 MED ORDER — LIVING WELL WITH DIABETES BOOK
Freq: Once | Status: DC
  Filled 2015-09-25: qty 1

## 2015-09-25 MED ORDER — TICAGRELOR 90 MG PO TABS: 90.0000 mg | ORAL_TABLET | Freq: Two times a day (BID) | ORAL | Status: DC

## 2015-09-25 MED ORDER — ASPIRIN 81 MG PO TBEC: 81.0000 mg | DELAYED_RELEASE_TABLET | Freq: Every day | ORAL | Status: AC

## 2015-09-25 MED ORDER — TICAGRELOR 90 MG PO TABS
90.0000 mg | ORAL_TABLET | Freq: Two times a day (BID) | ORAL | Status: DC
  Administered 2015-09-25: 90 mg via ORAL
  Filled 2015-09-25: qty 1

## 2015-09-25 MED ORDER — METFORMIN HCL 500 MG PO TABS: 500.0000 mg | ORAL_TABLET | Freq: Two times a day (BID) | ORAL | Status: DC

## 2015-09-25 NOTE — Progress Notes (Signed)
Nutrition Note:   Pt discharged to home. Written material on Carb Controlled Diabetic Diet mailed to pt home.   Romelle Starcherate Weaver Tweed MS, RD, LDN [phone] Pager  [phone] Weekend/On-Call Pager

## 2015-09-25 NOTE — Discharge Summary (Signed)
[facility] [place] [facility]   PATIENT NAME: Tyler Morales    MR#:  [id number]  DATE OF BIRTH:  April 19, 1966  DATE OF ADMISSION:  09/24/2015 ADMITTING PHYSICIAN: Arnaldo NatalMichael S Diamond, MD  DATE OF DISCHARGE: 09/25/2015  PRIMARY CARE PHYSICIAN: No PCP Per Patient    ADMISSION DIAGNOSIS:  Elevated troponin [R79.89] Non-STEMI (non-ST elevated myocardial infarction) (HCC) [I21.4] Essential hypertension [I10]  DISCHARGE DIAGNOSIS:  Active Problems:   NSTEMI (non-ST elevated myocardial infarction) (HCC)   SECONDARY DIAGNOSIS:   Past Medical History  Diagnosis Date  . Cerebral aneurysm without rupture     HOSPITAL COURSE:  Tyler Morales  is a 50 y.o. male admitted 09/24/2015 with chief complaint Chest Pain . Please see H&P performed by Arnaldo NatalMichael S Diamond, MD for further information. Patient presented with the above symptoms. Found to have elevated troponin - placed on heparin drip. Cardiology evaluated the patient and he underwent cardiac catheterization. Findings below.   Dist RCA lesion, 100% stenosed. The lesion was not previously treated.  3rd Mrg lesion, 60% stenosed. The lesion was not previously treated.  Dist LAD lesion, 40% stenosed. The lesion was not previously treated.  Status BMS to RCA with 0% residual stenosis. Resolution of symptoms   DISCHARGE CONDITIONS:   stable  CONSULTS OBTAINED:  Treatment Team:  Laurier NancyShaukat A Khan, MD  DRUG ALLERGIES:  No Known Allergies  DISCHARGE MEDICATIONS:   Current Discharge Medication List    START taking these medications   Details  aspirin EC 81 MG EC tablet Take 1 tablet (81 mg total) by mouth daily. Qty: 30 tablet, Refills: 0    atorvastatin (LIPITOR) 80 MG tablet Take 1 tablet (80 mg total) by mouth daily at 6 PM. Qty: 30 tablet, Refills: 0    ticagrelor (BRILINTA) 90 MG TABS tablet Take 1 tablet (90 mg total) by mouth 2 (two) times daily. Qty: 60 tablet, Refills: 0          DISCHARGE INSTRUCTIONS:  abstain from illicit drugs   DIET:  Cardiac diet  DISCHARGE CONDITION:  Stable  ACTIVITY:  Activity as tolerated  OXYGEN:  Home Oxygen: No.   Oxygen Delivery: room air  DISCHARGE LOCATION:  home   If you experience worsening of your admission symptoms, develop shortness of breath, life threatening emergency, suicidal or homicidal thoughts you must seek medical attention immediately by calling 911 or calling your MD immediately  if symptoms less severe.  You Must read complete instructions/literature along with all the possible adverse reactions/side effects for all the Medicines you take and that have been prescribed to you. Take any new Medicines after you have completely understood and accpet all the possible adverse reactions/side effects.   Please note  You were cared for by a hospitalist during your hospital stay. If you have any questions about your discharge medications or the care you received while you were in the hospital after you are discharged, you can call the unit and asked to speak with the hospitalist on call if the hospitalist that took care of you is not available. Once you are discharged, your primary care physician will handle any further medical issues. Please note that NO REFILLS for any discharge medications will be authorized once you are discharged, as it is imperative that you return to your primary care physician (or establish a relationship with a primary care physician if you do not have one) for your aftercare needs so that they can reassess your need for medications and  monitor your lab values.    On the day of Discharge:   VITAL SIGNS:  Blood pressure 143/95, pulse 84, temperature 99.4 F (37.4 C), temperature source Oral, resp. rate 20, height  (1.676 m), weight 150 lb 6.4 oz (68.221 kg), SpO2 93 %.  I/O:   Intake/Output Summary (Last 24 hours) at 09/25/15 1036 Last data filed at 09/25/15 0640  Gross per  24 hour  Intake 1783.75 ml  Output   1770 ml  Net  13.75 ml    PHYSICAL EXAMINATION:  GENERAL:  50 y.o.-year-old patient lying in the bed with no acute distress.  EYES: Pupils equal, round, reactive to light and accommodation. No scleral icterus. Extraocular muscles intact.  HEENT: Head atraumatic, normocephalic. Oropharynx and nasopharynx clear.  NECK:  Supple, no jugular venous distention. No thyroid enlargement, no tenderness.  LUNGS: Normal breath sounds bilaterally, no wheezing, rales,rhonchi or crepitation. No use of accessory muscles of respiration.  CARDIOVASCULAR: S1, S2 normal. No murmurs, rubs, or gallops.  ABDOMEN: Soft, non-tender, non-distended. Bowel sounds present. No organomegaly or mass.  EXTREMITIES: No pedal edema, cyanosis, or clubbing.  NEUROLOGIC: Cranial nerves II through XII are intact. Muscle strength 5/5 in all extremities. Sensation intact. Gait not checked.  PSYCHIATRIC: The patient is alert and oriented x 3.  SKIN: No obvious rash, lesion, or ulcer.   DATA REVIEW:   CBC  Recent Labs Lab 09/24/15 0350  WBC 11.1*  HGB 14.5  HCT 43.0  PLT 312    Chemistries   Recent Labs Lab 09/24/15 0350  NA 134*  K 3.9  CL 101  CO2 24  GLUCOSE 215*  BUN 13  CREATININE 1.25*  CALCIUM 9.5  AST 75*  ALT 27  ALKPHOS 52  BILITOT 0.6    Cardiac Enzymes  Recent Labs Lab 09/24/15 0350  TROPONINI 4.75*    Microbiology Results  No results found for this or any previous visit.  RADIOLOGY:  Dg Chest Portable 1 View  09/24/2015  CLINICAL DATA:  Chest pain, non radiating EXAM: PORTABLE CHEST 1 VIEW COMPARISON:  None available FINDINGS: Normal heart size and mediastinal contours. No infiltrate or edema. No effusion or pneumothorax. No acute osseous findings. IMPRESSION: Negative portable chest. Electronically Signed   By: Marnee Spring M.D.   On: 09/24/2015 04:11     Management plans discussed with the patient, family and they are in  agreement.  CODE STATUS:     Code Status Orders        Start     Ordered   09/24/15 0612  Full code   Continuous     09/24/15 0611    Code Status History    Date Active Date Inactive Code Status Order ID Comments User Context   This patient has a current code status but no historical code status.      TOTAL TIME TAKING CARE OF THIS PATIENT: 28 minutes.    Samual Beals,  Mardi Mainland.D on 09/25/2015 at 10:36 AM  Between 7am to 6pm - Pager - [phone]  After 6pm go to [email or web address] - Scientist, research (life sciences) [facility]  Office  [phone]  CC: Primary care physician; No PCP Per Patient

## 2015-09-25 NOTE — Progress Notes (Signed)
Patient d/c'd home. Education provided, no questions at this time. Patient picked up by wife. Telemetry removed. Treylin Burtch R Mansfield   

## 2015-09-25 NOTE — Progress Notes (Signed)
Inpatient Diabetes Program Recommendations  AACE/ADA: New Consensus Statement on Inpatient Glycemic Control (2015)  Target Ranges:  Prepandial:   less than 140 mg/dL      Peak postprandial:   less than 180 mg/dL (1-2 hours)      Critically ill patients:  140 - 180 mg/dL   Lab Results  Component Value Date   HGBA1C 9.2* 09/24/2015    Diabetes history: None noted however A1C is elevated Outpatient Diabetes medications: None Current orders for Inpatient glycemic control: None  Inpatient Diabetes Program Recommendations:    Note elevated A1C.  Is this new onset diabetes?  If so will need close follow-up and diabetes survival skill education while in the hospital.  Please also order capillary blood glucoses tid with meals and HS and Novolog correction.   Thanks, Tyler MeagerJenny Janashia Parco, RN, BC-ADM Inpatient Diabetes Coordinator Pager [phone]

## 2015-09-25 NOTE — Progress Notes (Signed)
  SUBJECTIVE: [name] is feeling well this morning without any chest pain or shortness of breath. His right groin is mildly tender.   Filed Vitals:   09/24/15 1430 09/24/15 1452 09/24/15 2034 09/25/15 0452  BP: 149/111 131/92 154/91 143/95  Pulse: 99 95 97 84  Temp:  97.9 F (36.6 C) 99.2 F (37.3 C) 99.4 F (37.4 C)  TempSrc:  Oral Oral Oral  Resp: 19 18 20 20   Height:      Weight:    150 lb 6.4 oz (68.221 kg)  SpO2: 98% 98% 96% 93%    Intake/Output Summary (Last 24 hours) at 09/25/15 0829 Last data filed at 09/25/15 0640  Gross per 24 hour  Intake 1783.75 ml  Output   1770 ml  Net  13.75 ml    LABS: Basic Metabolic Panel:  Recent Labs  16/01/9605/06/17 0350  NA 134*  K 3.9  CL 101  CO2 24  GLUCOSE 215*  BUN 13  CREATININE 1.25*  CALCIUM 9.5   Liver Function Tests:  Recent Labs  09/24/15 0350  AST 75*  ALT 27  ALKPHOS 52  BILITOT 0.6  PROT 8.4*  ALBUMIN 4.2    Recent Labs  09/24/15 0350  LIPASE 29   CBC:  Recent Labs  09/24/15 0350  WBC 11.1*  HGB 14.5  HCT 43.0  MCV 88.3  PLT 312   Cardiac Enzymes:  Recent Labs  09/24/15 0350  CKTOTAL 842*  TROPONINI 4.75*   BNP: Invalid input(s): POCBNP D-Dimer: No results for input(s): DDIMER in the last 72 hours. Hemoglobin A1C:  Recent Labs  09/24/15 0350  HGBA1C 9.2*   Fasting Lipid Panel: No results for input(s): CHOL, HDL, LDLCALC, TRIG, CHOLHDL, LDLDIRECT in the last 72 hours. Thyroid Function Tests:  Recent Labs  09/24/15 0350  TSH 2.712   Anemia Panel: No results for input(s): VITAMINB12, FOLATE, FERRITIN, TIBC, IRON, RETICCTPCT in the last 72 hours.   PHYSICAL EXAM General: Well developed, well nourished, in no acute distress HEENT:  Normocephalic and atramatic Neck:  No JVD.  Lungs: Clear bilaterally to auscultation and percussion. Heart: HRRR . Normal S1 and S2 without gallops or murmurs.  Abdomen: Bowel sounds are positive, abdomen soft and non-tender  Msk:   Back normal, normal gait. Normal strength and tone for age. Extremities: No clubbing, cyanosis or edema.  Right groin is soft with dressing intact, no bruit, mild tenderness Neuro: Alert and oriented X 3. Psych:  Good affect, responds appropriately  TELEMETRY: Sinus rhythm with a rate of 81  ASSESSMENT AND PLAN: Non-STEMI with troponin of 4.75. Taken to the Cath Lab yesterday and found to have occluded distal right coronary artery. A bare metal stent was placed with good results. He also has moderate disease to the left distal circumflex too small to intervene. Echocardiogram showed an EF of 60% and normal wall motion. The patient has been started on aspirin and statin. Brilinta has been ordered for stent maintenance. Discussed status and plan with the patient and his wife. He will need Brilinta therapy for at least 30 days in case manager will ensure that he has 30 day free card. Follow-up appointment has been given for Friday at 10:00 at [facility].  May discharge today with follow-up in the office to assess groin.  Active Problems:   NSTEMI (non-ST elevated myocardial infarction) (HCC)    Tyler BonJanine Ethaniel Garfield, NP 09/25/2015 8:29 AM

## 2015-09-25 NOTE — Care Management (Signed)
Patient being discharged on Brillinta. TC to Lizabeth LeydenNina Hammond, NP with Dr. Welton FlakesKhan. She states patient should only have to be on the medication for 30 days. She provided patient with a  30 day supply of samples. Spoke with patient and he is to pick them up at the front dest at Dr. Milta DeitersKhan's office. Patient independent, active and ambulatory other wise. He provides his own ADl's. Provided patient with an application to open dootr clinic and medication management clinic.

## 2015-09-25 NOTE — Progress Notes (Signed)
Spoke briefly with patient regarding elevated A1C.  Gave him Living well with diabetes booklet and information regarding glucose meter purchase.  Discussed complications of diabetes, Type 2 diabetes and the importance of a healthy lifestyle.  Also discussed metformin and possible side effects.  Reminded patient that diabetes is a life long disease and that he will need follow-up with PCP for management.  Patient nodded head however seemed very surprised of new diagnosis.  Will request dietician consult also.  Instructed him to read through diabetes booklet and feel free to ask questions.  Thanks, Beryl MeagerJenny Louann Hopson, RN, BC-ADM Inpatient Diabetes Coordinator Pager [phone] (8a-5p)

## 2015-09-25 NOTE — Progress Notes (Signed)
   [place] [facility] AT [facility] [address] [place], [place] [address]  September 25, 2015  Patient:  Eleonore ChiquitoKeith Hubka Date of Birth: 10-Oct-1965 Date of Visit:  09/24/2015  To Whom it May Concern:  Please excuse Valera CastleKeith D Montz from work from 09/24/2015 until 09/25/2015 as he was admitted to the [facility] for medical treatment and has been receiving appropriate care. He may return to work on 09/30/15, or sooner if he feels he is able to return sooner than this date.      Please don't hesitate to contact me with questions or concerns by calling  [phone] and asking them to page me directly.   Marge Duncansave Hower, MD

## 2015-10-11 ENCOUNTER — Ambulatory Visit: Payer: Self-pay

## 2015-12-18 ENCOUNTER — Encounter: Payer: Self-pay | Admitting: Internal Medicine

## 2016-08-21 ENCOUNTER — Ambulatory Visit: Payer: Medicaid Other | Admitting: Dietician

## 2016-08-25 ENCOUNTER — Encounter: Payer: BLUE CROSS/BLUE SHIELD | Attending: Internal Medicine | Admitting: Dietician

## 2016-08-25 ENCOUNTER — Encounter: Payer: Self-pay | Admitting: Dietician

## 2016-08-25 VITALS — Ht 66.0 in | Wt 183.6 lb

## 2016-08-25 DIAGNOSIS — E119 Type 2 diabetes mellitus without complications: Secondary | ICD-10-CM | POA: Insufficient documentation

## 2016-08-25 DIAGNOSIS — E118 Type 2 diabetes mellitus with unspecified complications: Secondary | ICD-10-CM

## 2016-08-25 DIAGNOSIS — Z713 Dietary counseling and surveillance: Secondary | ICD-10-CM | POA: Diagnosis present

## 2016-08-25 NOTE — Progress Notes (Signed)
Medical Nutrition Therapy: Visit start time: 1540  end time: 1640 Assessment:  Diagnosis: Type 2 Diabetes Past medical history: MI, HTN Psychosocial issues/ stress concerns: none identified Preferred learning method:  . Visual  Current weight: 183.6 lbs  Height: 66 in Medications, supplements: see list Progress and evaluation:  Patient accompanied by his wife in for initial medical nutrition therapy visit. Reports he was diagnosed with diabetes when hospitalized for MI 09/2015. He reports he has significantly decreased sodas from several/day to 1 per week. He reports that he often skips breakfast or eats chips and drinks water. Most of his lunch meals are "fast foods", usually high fat choices such as [employer]. He drinks lemonade or other fruit drinks at lunch.  Sometimes due to his work schedule, he skips lunch making if difficult to control portions for evening meal. His wife is preparing most meats with an air fryer. Most of the evening meals are prepared at home.  His wife states that they have stopped adding salt to any foods.  He doesn't check his blood sugars on a regular basis but states the lowest reading recently was 136.  His most recent HgA1c was 9.5.  Physical activity: no structured exercise  Dietary Intake:  Usual eating pattern includes 2-3 meals and 2-3 snacks per day. Dining out frequency: 6 meals per week.  Breakfast: 6:45- skips or eat chips, water Lunch: 11-1:00pm- [employer] chicken, potatoes, lemonade or Chinese sesame chicken and rice. Snack: chips Supper: 6:00pm- pork chops or chicken or steak with vegetables., tea with Splenda Snack: sandwich Beverages: juice, lemonade, water with lemon, tea with Splenda  Nutrition Care Education:  Diabetes:  Commended on positive diet changes that he has already made. Teaching included:  goals for BGs, appropriate meal and snack schedule, Carb counting, appropriate carb intake and balance, label reading, menu examples  Hyperlipidemia:   target goals for lipids, healthy and unhealthy fats,  Other lifestyle changes:  benefits of making changes, increasing motivation, readiness for change, identifying habits that need to change, food and drug interactions  Nutritional Diagnosis:  NI-5.8.4 Inconsistent carbohydrate intake As related to skipping meals, sweetened beverages, large portions at evening meals.  As evidenced by diet history..  Intervention/Instruction:  Eat breakfast daily that includes at least 1-2 starches and protein. Balance lunch and dinner with 2-4 ounces of protein, 3-4 servings of carbohydrate and free vegetables. Switch to all sugar free beverages. Also, the lemon in water that you mentioned is a great choice and tea with Splenda is a good choice. Read labels for carbohydrate, saturated fat and trans fat.  Limit saturated fat to no more than 12 gms of saturated fat daily. Use phone apps or websites given to look up nutrition information especially for restaurant foods.  Education Materials given:  .  Marland Kitchen. Plate Planner . Food lists/ Planning A Balanced Meal . Sample meal pattern/ menus . Goals/ instructions  Learner/ who was taught:  . Patient  . Spouse/ partner Level of understanding: . Partial understanding; needs review/ practice Demonstrated degree of understanding via:   Teach back Learning barriers: . None  Willingness to learn/ readiness for change: .  Change in progress  Monitoring and Evaluation:  Dietary intake, exercise, , and body weight      follow up: none scheduled at this time. Patient and his wife were encouraged to call if desires further help with meal plan/nutrition.

## 2016-08-25 NOTE — Patient Instructions (Signed)
Eat breakfast daily that includes at least 1-2 starches and protein. Balance lunch and dinner with 2-4 ounces of protein, 3-4 servings of carbohydrate and free vegetables. Switch to all sugar free beverages. Also, the lemon in water that you mentioned is a great choice and tea with Splenda is a good choice. Read labels for carbohydrate, saturated fat and trans fat.  Limit saturated fat to no more than 12 gms of saturated fat daily. Use phone apps or websites given to look up nutrition information especially for restaurant foods.

## 2016-09-29 ENCOUNTER — Telehealth: Payer: Self-pay | Admitting: Gastroenterology

## 2016-09-29 NOTE — Telephone Encounter (Signed)
Returned patient's call per msg:   Patient called and received a letter to schedule procedure     LVM for patient callback.

## 2016-09-29 NOTE — Telephone Encounter (Signed)
Patient called and received a letter to schedule procedure

## 2017-12-07 ENCOUNTER — Emergency Department
Admission: EM | Admit: 2017-12-07 | Discharge: 2017-12-07 | Disposition: A | Discharge status: Home / Self Care | Payer: BLUE CROSS/BLUE SHIELD | Attending: Emergency Medicine | Admitting: Emergency Medicine

## 2017-12-07 ENCOUNTER — Encounter: Payer: Self-pay | Admitting: Emergency Medicine

## 2017-12-07 DIAGNOSIS — Z79899 Other long term (current) drug therapy: Secondary | ICD-10-CM | POA: Insufficient documentation

## 2017-12-07 DIAGNOSIS — I10 Essential (primary) hypertension: Secondary | ICD-10-CM | POA: Insufficient documentation

## 2017-12-07 DIAGNOSIS — K0889 Other specified disorders of teeth and supporting structures: Secondary | ICD-10-CM | POA: Diagnosis present

## 2017-12-07 DIAGNOSIS — Z7984 Long term (current) use of oral hypoglycemic drugs: Secondary | ICD-10-CM | POA: Insufficient documentation

## 2017-12-07 DIAGNOSIS — Z7982 Long term (current) use of aspirin: Secondary | ICD-10-CM | POA: Diagnosis not present

## 2017-12-07 DIAGNOSIS — I252 Old myocardial infarction: Secondary | ICD-10-CM | POA: Insufficient documentation

## 2017-12-07 DIAGNOSIS — F17210 Nicotine dependence, cigarettes, uncomplicated: Secondary | ICD-10-CM | POA: Diagnosis not present

## 2017-12-07 DIAGNOSIS — K029 Dental caries, unspecified: Secondary | ICD-10-CM | POA: Diagnosis not present

## 2017-12-07 HISTORY — DX: Essential (primary) hypertension: I10

## 2017-12-07 MED ORDER — NAPROXEN 500 MG PO TABS: 500.0000 mg | ORAL_TABLET | Freq: Two times a day (BID) | ORAL | 0 refills | Status: AC

## 2017-12-07 MED ORDER — AMOXICILLIN 500 MG PO CAPS
500.0000 mg | ORAL_CAPSULE | Freq: Once | ORAL | Status: AC
  Administered 2017-12-07: 500 mg via ORAL
  Filled 2017-12-07: qty 1

## 2017-12-07 MED ORDER — LIDOCAINE-EPINEPHRINE 2 %-1:100000 IJ SOLN
1.7000 mL | Freq: Once | INTRAMUSCULAR | Status: AC
  Administered 2017-12-07: 1.7 mL
  Filled 2017-12-07: qty 1.7

## 2017-12-07 MED ORDER — TRAMADOL HCL 50 MG PO TABS
50.0000 mg | ORAL_TABLET | Freq: Once | ORAL | Status: AC
  Administered 2017-12-07: 50 mg via ORAL
  Filled 2017-12-07: qty 1

## 2017-12-07 MED ORDER — AMOXICILLIN 500 MG PO CAPS: 500.0000 mg | ORAL_CAPSULE | Freq: Three times a day (TID) | ORAL | 0 refills | Status: DC

## 2017-12-07 NOTE — ED Triage Notes (Signed)
Pt c/o left upper dental pain x2 days where tooth is loose. Pt denies drainage and fever.

## 2017-12-07 NOTE — Discharge Instructions (Signed)
Take the antibiotic as directed. Follow-up with one of the dental clinics listed below.   OPTIONS FOR DENTAL FOLLOW UP CARE  [place] Department of Health and Human Services - Local Safety Net Dental Clinics http://www.ncdhhs.gov/dph/oralhealth/services/safetynetclinics.htm   [facility] ([phone])  [facility] ([phone])  [facility] ([phone])  [facility] ([phone])  [facility] ([phone]) This clinic caters to the indigent population and is on a lottery system. Location: [facility], [address], [place] Clinic Hours: Wednesdays from 6pm - 9pm, patients seen by a lottery system. For dates, call or go to www.med.[facility].edu/[facility]/patients/Dental-[facility] Services: Cleanings, fillings and simple extractions. Payment Options: DENTAL WORK IS FREE OF CHARGE. Bring proof of income or support. Best way to get seen: Arrive at 5:15 pm - this is a lottery, NOT first come/first serve, so arriving earlier will not increase your chances of being seen.     [facility] [phone] Select option 1 for emergencies   Location: [facility], [address], [place] Clinic Hours: No walk-ins accepted - call the day before to schedule an appointment. Check in times are 9:30 am and 1:30 pm. Services: Simple extractions, temporary fillings, pulpectomy/pulp debridement, uncomplicated abscess drainage. Payment Options: PAYMENT IS DUE AT THE TIME OF SERVICE.  Fee is usually $100-200, additional surgical procedures (e.g. abscess drainage) may be extra. Cash, checks, Visa/MasterCard accepted.  Can file Medicaid if patient is covered for dental - patient should call case worker to check. No discount for [facility] patients. Best way to get seen: MUST call the day before and get onto the schedule. Can usually be seen the next  1-2 days. No walk-ins accepted.     [facility] [phone]   Location: [facility], [address], [place] Clinic Hours: M, W, Th, F 8am or 1:30pm, Tues 9a or 1:30 - first come/first served. Services: Simple extractions, temporary fillings, uncomplicated abscess drainage.  You do not need to be an [place] resident. Payment Options: PAYMENT IS DUE AT THE TIME OF SERVICE. Dental insurance, otherwise sliding scale - bring proof of income or support. Depending on income and treatment needed, cost is usually $50-200. Best way to get seen: Arrive early as it is first come/first served.     [facility] [phone]   Location: [address] Clinic Hours: Mon-Thu 8a-5p Services: Most basic dental services including extractions and fillings. Payment Options: PAYMENT IS DUE AT THE TIME OF SERVICE. Sliding scale, up to 50% off - bring proof if income or support. Medicaid with dental option accepted. Best way to get seen: Call to schedule an appointment, can usually be seen within 2 weeks OR they will try to see walk-ins - show up at 8a or 2p (you may have to wait).     [facility] [phone] [place] RESIDENTS ONLY   Location: [facility], [address], [place], [place] [address] Clinic Hours: By appointment only. Monday - Thursday 8am-5pm, Friday 8am-12pm Services: Cleanings, fillings, extractions. Payment Options: PAYMENT IS DUE AT THE TIME OF SERVICE. Cash, Visa or MasterCard. Sliding scale - $30 minimum per service. Best way to get seen: Come in to office, complete packet and make an appointment - need proof of income or support monies for each household member and proof of [place] residence. Usually takes about a month to get in.       [facility] [phone]   Location: [address],  [place] Clinic Hours: Walk-in Urgent Care Dental Services are offered Monday-Friday mornings only. The numbers of emergencies accepted daily is limited to the number of providers available. Maximum 15 - Mondays, Wednesdays & Thursdays Maximum 10 - Tuesdays & Fridays Services: You do not need to be a [facility] resident to be seen for a dental emergency. Emergencies are defined as pain, swelling, abnormal bleeding, or dental trauma. Walkins will receive x-rays if needed. NOTE: Dental cleaning is not an emergency. Payment Options: PAYMENT IS DUE AT THE TIME OF SERVICE. Minimum co-pay is $40.00 for uninsured patients. Minimum co-pay is $3.00 for Medicaid with dental coverage. Dental Insurance is accepted and must be presented at time of visit. Medicare does not cover dental. Forms of payment: Cash, credit card, checks. Best way to get seen: If not previously registered with the clinic, walk-in dental registration begins at 7:15 am and is on a first come/first serve basis. If previously registered with the clinic, call to make an appointment.     [facility] [place] ONLY   Location: [address], [place], [place] Clinic Hours: Mon-Thu 10a-2p Services: Extractions only! Payment Options: FREE (donations accepted) - bring proof of income or support Best way to get seen: Call and schedule an appointment OR come at 8am on the 1st Monday of every month (except for holidays) when it is first come/first served.     [facility] [phone]   Location: [place], [place] Clinic Hours: Friday mornings Services, Payment Options, Best way to get seen: Call for info

## 2017-12-07 NOTE — ED Provider Notes (Signed)
[facility] Emergency Department Provider Note ____________________________________________  Time seen: 2234  I have reviewed the triage vital signs and the nursing notes.  HISTORY  Chief Complaint  Dental Pain  HPI Tyler Morales is a 52 y.o. male presents himself to the ED for evaluation of acute dental pain.  Patient with poor dentition including chronic loose teeth, presents with pain to the left upper premolar.  He denies any recent injury, accident, or trauma to the mouth. He also denies any spontaneous purulent drainage or facial & gum swelling.  He denies any fevers, chills, or sweats.  He has been taking over-the-counter Excedrin with limited benefit.  He reports the pain is been so significant he has been unable to dose his daily blood pressure medicine for the last 2 days.  Past Medical History:  Diagnosis Date  . Cerebral aneurysm without rupture   . Hypertension     Patient Active Problem List   Diagnosis Date Noted  . NSTEMI (non-ST elevated myocardial infarction) (HCC) 09/24/2015    Past Surgical History:  Procedure Laterality Date  . CARDIAC CATHETERIZATION Right 09/24/2015   Procedure: Left Heart Cath and Coronary Angiography;  Surgeon: Laurier NancyShaukat A Khan, MD;  Location: [facility] INVASIVE CV LAB;  Service: Cardiovascular;  Laterality: Right;  . CARDIAC CATHETERIZATION N/A 09/24/2015   Procedure: Coronary Stent Intervention;  Surgeon: Alwyn Peawayne D Callwood, MD;  Location: [facility] INVASIVE CV LAB;  Service: Cardiovascular;  Laterality: N/A;  . SHOULDER SURGERY Left     Prior to Admission medications   Medication Sig Start Date End Date Taking? Authorizing Provider  amoxicillin (AMOXIL) 500 MG capsule Take 1 capsule (500 mg total) by mouth 3 (three) times daily. 12/07/17   Oline Belk, Charlesetta IvoryJenise V Bacon, PA-C  aspirin EC 81 MG EC tablet Take 1 tablet (81 mg total) by mouth daily. 09/25/15   Hower, Cletis Athensavid K, MD  atorvastatin (LIPITOR) 80 MG tablet Take 1 tablet (80 mg  total) by mouth daily at 6 PM. 09/25/15   Hower, Cletis Athensavid K, MD  carvedilol (COREG) 12.5 MG tablet Take 12.5 mg by mouth 2 (two) times daily. 07/12/16   [provider]  hydrochlorothiazide (HYDRODIURIL) 25 MG tablet Take 25 mg by mouth daily.    [provider]  metFORMIN (GLUCOPHAGE) 500 MG tablet Take 1 tablet (500 mg total) by mouth 2 (two) times daily with a meal. 09/25/15   Hower, Cletis Athensavid K, MD  naproxen (NAPROSYN) 500 MG tablet Take 1 tablet (500 mg total) by mouth 2 (two) times daily with a meal for 15 days. 12/07/17 12/22/17  Ksenia Kunz, Charlesetta IvoryJenise V Bacon, PA-C  ticagrelor (BRILINTA) 90 MG TABS tablet Take 1 tablet (90 mg total) by mouth 2 (two) times daily. 09/25/15   Hower, Cletis Athensavid K, MD    Allergies Patient has no known allergies.  Family History  Problem Relation Age of Onset  . Diabetes Mellitus II Unknown     Social History Social History   Tobacco Use  . Smoking status: Current Every Day Smoker    Packs/day: 0.50    Types: Cigarettes  . Smokeless tobacco: Never Used  Substance Use Topics  . Alcohol use: Yes    Alcohol/week: 1.0 standard drinks    Types: 1 Cans of beer per week    Comment: weekly  . Drug use: No    Review of Systems  Constitutional: Negative for fever. Eyes: Negative for visual changes. ENT: Negative for sore throat. Dental pain as above. Cardiovascular: Negative for chest pain.  Respiratory: Negative for shortness of breath. Gastrointestinal: Negative for abdominal pain, vomiting and diarrhea. Musculoskeletal: Negative for back pain. Skin: Negative for rash. Neurological: Negative for headaches, focal weakness or numbness. ____________________________________________  PHYSICAL EXAM:  VITAL SIGNS: ED Triage Vitals [12/07/17 2149]  Enc Vitals Group     BP (!) 180/93     Pulse Rate 83     Resp 17     Temp 98.8 F (37.1 C)     Temp Source Oral     SpO2 100 %     Weight      Height      Head Circumference      Peak Flow      Pain  Score 10     Pain Loc      Pain Edu?      Excl. in GC?     Constitutional: Alert and oriented. Well appearing and in no distress. Head: Normocephalic and atraumatic. Eyes: Conjunctivae are normal. PERRL. Normal extraocular movements Ears: Canals clear. TMs intact bilaterally. Nose: No congestion/rhinorrhea/epistaxis. Mouth/Throat: Mucous membranes are moist.  Uvula is midline and tonsils are flat.  Patient with multiple missing teeth and significant gum recession.  The left upper premolar is loose and shows significant laxity with manipulation.  The root of the tooth is exposed secondary to the gum recession.  The same applies to the left upper third molar, there is significant exposure of the dental root.  But the tooth is without laxity or subluxation.  No focal gum swelling, erythema, pointing, or drainage is appreciated.  Neck: Supple. No thyromegaly. Hematological/Lymphatic/Immunological: No cervical lymphadenopathy. Cardiovascular: Normal rate, regular rhythm. Normal distal pulses. Respiratory: Normal respiratory effort. No wheezes/rales/rhonchi. Psychiatric: Mood and affect are normal. Patient exhibits appropriate insight and judgment. ____________________________________________  PROCEDURES  Amoxicillin 500 mg PO Ultram 50 mg Po  DENTAL BLOCK  Performed by: Lissa HoardMenshew, Tatumn Corbridge V Bacon Consent: Verbal consent obtained. Required items: devices and special equipment available Time out: Immediately prior to procedure a "time out" was called to verify the correct patient, procedure, equipment, support staff and site/side marked as required.  Indication: pain Nerve block body site: left upper premolar & 3rd molar  Preparation: Patient was prepped and draped in the usual sterile fashion. Needle gauge: 27 G Location technique: anatomical landmarks  Local anesthetic: lido w/ epi 2%-1:100000  Anesthetic total: 1.7 ml  Outcome: pain improved Patient tolerance: Patient tolerated  the procedure well with no immediate complications. ____________________________________________  INITIAL IMPRESSION / ASSESSMENT AND PLAN / ED COURSE  Patient with ED evaluation of acute dental pain.  Patient's exam is consistent with chronic dental caries, chronic gum recession, and acute dental pain.  He will be discharged with an empiric prescription for amoxicillin as well as a prescription for naproxen for pain relief.  He will follow-up with one of the dental clinics in the community for definitive management. ____________________________________________  FINAL CLINICAL IMPRESSION(S) / ED DIAGNOSES  Final diagnoses:  Pain due to dental caries      Karmen StabsMenshew, Charlesetta IvoryJenise V Bacon, PA-C 12/07/17 2322    Jeanmarie PlantMcShane, James A, MD 12/07/17 2341

## 2018-03-01 ENCOUNTER — Encounter: Payer: Self-pay | Admitting: *Deleted

## 2018-03-02 ENCOUNTER — Ambulatory Visit
Admission: RE | Admit: 2018-03-02 | Discharge: 2018-03-02 | Disposition: A | Discharge status: Home / Self Care | Payer: BLUE CROSS/BLUE SHIELD | Source: Ambulatory Visit | Attending: Internal Medicine | Admitting: Internal Medicine

## 2018-03-02 ENCOUNTER — Encounter: Payer: Self-pay | Admitting: Certified Registered Nurse Anesthetist

## 2018-03-02 ENCOUNTER — Encounter
Admission: RE | Disposition: A | Discharge status: Home / Self Care | Payer: Self-pay | Source: Ambulatory Visit | Attending: Internal Medicine

## 2018-03-02 DIAGNOSIS — Z539 Procedure and treatment not carried out, unspecified reason: Secondary | ICD-10-CM | POA: Diagnosis present

## 2018-03-02 HISTORY — DX: Chronic kidney disease, unspecified: N18.9

## 2018-03-02 HISTORY — DX: Type 2 diabetes mellitus without complications: E11.9

## 2018-03-02 HISTORY — DX: Hyperlipidemia, unspecified: E78.5

## 2018-03-02 HISTORY — DX: Atherosclerotic heart disease of native coronary artery without angina pectoris: I25.10

## 2018-03-02 HISTORY — DX: Acute myocardial infarction, unspecified: I21.9

## 2018-03-02 LAB — URINE DRUG SCREEN, QUALITATIVE (ARMC ONLY)
AMPHETAMINES, UR SCREEN: NOT DETECTED
BENZODIAZEPINE, UR SCRN: NOT DETECTED
Barbiturates, Ur Screen: NOT DETECTED
CANNABINOID 50 NG, UR ~~LOC~~: POSITIVE — AB
Cocaine Metabolite,Ur ~~LOC~~: POSITIVE — AB
MDMA (ECSTASY) UR SCREEN: NOT DETECTED
Methadone Scn, Ur: NOT DETECTED
Opiate, Ur Screen: NOT DETECTED
Phencyclidine (PCP) Ur S: NOT DETECTED
TRICYCLIC, UR SCREEN: NOT DETECTED

## 2018-03-02 LAB — GLUCOSE, CAPILLARY: GLUCOSE-CAPILLARY: 187 mg/dL — AB (ref 70–99)

## 2018-03-02 SURGERY — COLONOSCOPY WITH PROPOFOL
Anesthesia: General

## 2018-03-02 MED ORDER — SODIUM CHLORIDE 0.9 % IV SOLN: INTRAVENOUS | Status: DC

## 2018-03-02 MED ORDER — PHENYLEPHRINE HCL 10 MG/ML IJ SOLN
INTRAMUSCULAR | Status: AC
  Filled 2018-03-02: qty 1

## 2018-05-25 ENCOUNTER — Encounter: Payer: Self-pay | Admitting: Emergency Medicine

## 2018-05-25 ENCOUNTER — Other Ambulatory Visit: Payer: Self-pay

## 2018-05-25 ENCOUNTER — Emergency Department: Payer: BLUE CROSS/BLUE SHIELD

## 2018-05-25 ENCOUNTER — Inpatient Hospital Stay
Admission: EM | Admit: 2018-05-25 | Discharge: 2018-05-26 | DRG: 282 | Disposition: A | Discharge status: Home / Self Care | Payer: BLUE CROSS/BLUE SHIELD | Attending: Internal Medicine | Admitting: Internal Medicine

## 2018-05-25 DIAGNOSIS — Z7984 Long term (current) use of oral hypoglycemic drugs: Secondary | ICD-10-CM

## 2018-05-25 DIAGNOSIS — N189 Chronic kidney disease, unspecified: Secondary | ICD-10-CM | POA: Diagnosis present

## 2018-05-25 DIAGNOSIS — Z833 Family history of diabetes mellitus: Secondary | ICD-10-CM

## 2018-05-25 DIAGNOSIS — Z7902 Long term (current) use of antithrombotics/antiplatelets: Secondary | ICD-10-CM | POA: Diagnosis not present

## 2018-05-25 DIAGNOSIS — I129 Hypertensive chronic kidney disease with stage 1 through stage 4 chronic kidney disease, or unspecified chronic kidney disease: Secondary | ICD-10-CM | POA: Diagnosis present

## 2018-05-25 DIAGNOSIS — Z9114 Patient's other noncompliance with medication regimen: Secondary | ICD-10-CM

## 2018-05-25 DIAGNOSIS — E785 Hyperlipidemia, unspecified: Secondary | ICD-10-CM | POA: Diagnosis present

## 2018-05-25 DIAGNOSIS — I252 Old myocardial infarction: Secondary | ICD-10-CM

## 2018-05-25 DIAGNOSIS — I214 Non-ST elevation (NSTEMI) myocardial infarction: Secondary | ICD-10-CM | POA: Diagnosis present

## 2018-05-25 DIAGNOSIS — R079 Chest pain, unspecified: Secondary | ICD-10-CM

## 2018-05-25 DIAGNOSIS — Z955 Presence of coronary angioplasty implant and graft: Secondary | ICD-10-CM

## 2018-05-25 DIAGNOSIS — Z7982 Long term (current) use of aspirin: Secondary | ICD-10-CM

## 2018-05-25 DIAGNOSIS — E1122 Type 2 diabetes mellitus with diabetic chronic kidney disease: Secondary | ICD-10-CM | POA: Diagnosis present

## 2018-05-25 DIAGNOSIS — F1490 Cocaine use, unspecified, uncomplicated: Secondary | ICD-10-CM | POA: Diagnosis present

## 2018-05-25 DIAGNOSIS — Z79899 Other long term (current) drug therapy: Secondary | ICD-10-CM | POA: Diagnosis not present

## 2018-05-25 DIAGNOSIS — I2510 Atherosclerotic heart disease of native coronary artery without angina pectoris: Secondary | ICD-10-CM | POA: Diagnosis present

## 2018-05-25 DIAGNOSIS — F17210 Nicotine dependence, cigarettes, uncomplicated: Secondary | ICD-10-CM | POA: Diagnosis present

## 2018-05-25 LAB — BASIC METABOLIC PANEL
ANION GAP: 14 (ref 5–15)
BUN: 10 mg/dL (ref 6–20)
CHLORIDE: 100 mmol/L (ref 98–111)
CO2: 19 mmol/L — AB (ref 22–32)
Calcium: 9.2 mg/dL (ref 8.9–10.3)
Creatinine, Ser: 1.04 mg/dL (ref 0.61–1.24)
GFR calc non Af Amer: >60 mL/min (ref >60)
GLUCOSE: 278 mg/dL — AB (ref 70–99)
Potassium: 3.7 mmol/L (ref 3.5–5.1)
Sodium: 133 mmol/L — ABNORMAL LOW (ref 135–145)

## 2018-05-25 LAB — TROPONIN I
Troponin I: 0.07 ng/mL (ref <0.03)
Troponin I: 4.45 ng/mL (ref <0.03)
Troponin I: 3.67 ng/mL (ref <0.03)

## 2018-05-25 LAB — CBC WITH DIFFERENTIAL/PLATELET
Abs Immature Granulocytes: 0.03 10*3/uL (ref 0.00–0.07)
BASOS PCT: 0 %
Basophils Absolute: 0.1 10*3/uL (ref 0.0–0.1)
EOS ABS: 0.1 10*3/uL (ref 0.0–0.5)
EOS PCT: 1 %
HEMATOCRIT: 46.1 % (ref 39.0–52.0)
Hemoglobin: 15.8 g/dL (ref 13.0–17.0)
Immature Granulocytes: 0 %
LYMPHS ABS: 2.1 10*3/uL (ref 0.7–4.0)
Lymphocytes Relative: 17 %
MCH: 29.8 pg (ref 26.0–34.0)
MCHC: 34.3 g/dL (ref 30.0–36.0)
MCV: 87 fL (ref 80.0–100.0)
MONOS PCT: 5 %
Monocytes Absolute: 0.6 10*3/uL (ref 0.1–1.0)
Neutro Abs: 9.7 10*3/uL — ABNORMAL HIGH (ref 1.7–7.7)
Neutrophils Relative %: 77 %
Platelets: 335 10*3/uL (ref 150–400)
RBC: 5.3 MIL/uL (ref 4.22–5.81)
RDW: 12.6 % (ref 11.5–15.5)
WBC: 12.6 10*3/uL — ABNORMAL HIGH (ref 4.0–10.5)
nRBC: 0 % (ref 0.0–0.2)

## 2018-05-25 LAB — GLUCOSE, CAPILLARY
Glucose-Capillary: 356 mg/dL — ABNORMAL HIGH (ref 70–99)
Glucose-Capillary: 363 mg/dL — ABNORMAL HIGH (ref 70–99)
Glucose-Capillary: 352 mg/dL — ABNORMAL HIGH (ref 70–99)

## 2018-05-25 LAB — HEPARIN LEVEL (UNFRACTIONATED)
HEPARIN UNFRACTIONATED: 0.33 [IU]/mL (ref 0.30–0.70)
Heparin Unfractionated: 0.39 IU/mL (ref 0.30–0.70)

## 2018-05-25 LAB — HEMOGLOBIN A1C
Hgb A1c MFr Bld: 13.5 % — ABNORMAL HIGH (ref 4.8–5.6)
Mean Plasma Glucose: 340.75 mg/dL

## 2018-05-25 LAB — URINE DRUG SCREEN, QUALITATIVE (ARMC ONLY)
Amphetamines, Ur Screen: NOT DETECTED
BARBITURATES, UR SCREEN: NOT DETECTED
Benzodiazepine, Ur Scrn: NOT DETECTED
COCAINE METABOLITE, UR ~~LOC~~: POSITIVE — AB
Cannabinoid 50 Ng, Ur ~~LOC~~: POSITIVE — AB
MDMA (Ecstasy)Ur Screen: NOT DETECTED
Methadone Scn, Ur: NOT DETECTED
Opiate, Ur Screen: NOT DETECTED
Phencyclidine (PCP) Ur S: NOT DETECTED
Tricyclic, Ur Screen: NOT DETECTED

## 2018-05-25 LAB — LIPID PANEL
Cholesterol: 209 mg/dL — ABNORMAL HIGH (ref 0–200)
HDL: 35 mg/dL — ABNORMAL LOW (ref >40)
LDL CALC: 118 mg/dL — AB (ref 0–99)
TRIGLYCERIDES: 282 mg/dL — AB (ref <150)
Total CHOL/HDL Ratio: 6 RATIO
VLDL: 56 mg/dL — ABNORMAL HIGH (ref 0–40)

## 2018-05-25 LAB — PROTIME-INR
INR: 0.99
Prothrombin Time: 13 seconds (ref 11.4–15.2)

## 2018-05-25 LAB — APTT: aPTT: 28 seconds (ref 24–36)

## 2018-05-25 MED ORDER — ASPIRIN 81 MG PO CHEW
324.0000 mg | CHEWABLE_TABLET | Freq: Once | ORAL | Status: AC
  Administered 2018-05-25: 324 mg via ORAL
  Filled 2018-05-25: qty 4

## 2018-05-25 MED ORDER — INSULIN ASPART 100 UNIT/ML ~~LOC~~ SOLN
0.0000 [IU] | Freq: Three times a day (TID) | SUBCUTANEOUS | Status: DC
  Administered 2018-05-25 (×2): 9 [IU] via SUBCUTANEOUS
  Administered 2018-05-26: 3 [IU] via SUBCUTANEOUS
  Administered 2018-05-26: 7 [IU] via SUBCUTANEOUS
  Filled 2018-05-25 (×3): qty 1

## 2018-05-25 MED ORDER — TICAGRELOR 90 MG PO TABS
90.0000 mg | ORAL_TABLET | Freq: Two times a day (BID) | ORAL | Status: DC
  Administered 2018-05-25 – 2018-05-26 (×2): 90 mg via ORAL
  Filled 2018-05-25 (×2): qty 1

## 2018-05-25 MED ORDER — DOCUSATE SODIUM 100 MG PO CAPS: 100.0000 mg | ORAL_CAPSULE | Freq: Two times a day (BID) | ORAL | Status: DC | PRN

## 2018-05-25 MED ORDER — CARVEDILOL 12.5 MG PO TABS
12.5000 mg | ORAL_TABLET | Freq: Two times a day (BID) | ORAL | Status: DC
  Administered 2018-05-25 – 2018-05-26 (×2): 12.5 mg via ORAL
  Filled 2018-05-25 (×2): qty 1

## 2018-05-25 MED ORDER — SODIUM CHLORIDE 0.9 % WEIGHT BASED INFUSION
3.0000 mL/kg/h | INTRAVENOUS | Status: AC
  Administered 2018-05-26 (×2): 3 mL/kg/h via INTRAVENOUS

## 2018-05-25 MED ORDER — ASPIRIN EC 81 MG PO TBEC: 81.0000 mg | DELAYED_RELEASE_TABLET | Freq: Every day | ORAL | Status: DC

## 2018-05-25 MED ORDER — SODIUM CHLORIDE 0.9% FLUSH
3.0000 mL | Freq: Two times a day (BID) | INTRAVENOUS | Status: DC
  Administered 2018-05-25: 3 mL via INTRAVENOUS

## 2018-05-25 MED ORDER — SODIUM CHLORIDE 0.9 % IV SOLN: 250.0000 mL | INTRAVENOUS | Status: DC | PRN

## 2018-05-25 MED ORDER — HEPARIN BOLUS VIA INFUSION
4000.0000 [IU] | Freq: Once | INTRAVENOUS | Status: AC
  Administered 2018-05-25: 4000 [IU] via INTRAVENOUS
  Filled 2018-05-25: qty 4000

## 2018-05-25 MED ORDER — SODIUM CHLORIDE 0.9% FLUSH: 3.0000 mL | INTRAVENOUS | Status: DC | PRN

## 2018-05-25 MED ORDER — ASPIRIN 81 MG PO CHEW
81.0000 mg | CHEWABLE_TABLET | ORAL | Status: AC
  Administered 2018-05-26: 81 mg via ORAL
  Filled 2018-05-25: qty 1

## 2018-05-25 MED ORDER — HYDROCHLOROTHIAZIDE 12.5 MG PO CAPS
12.5000 mg | ORAL_CAPSULE | Freq: Every day | ORAL | Status: DC
  Administered 2018-05-25 – 2018-05-26 (×2): 12.5 mg via ORAL
  Filled 2018-05-25 (×2): qty 1

## 2018-05-25 MED ORDER — INSULIN ASPART 100 UNIT/ML ~~LOC~~ SOLN
0.0000 [IU] | Freq: Every day | SUBCUTANEOUS | Status: DC
  Administered 2018-05-25: 5 [IU] via SUBCUTANEOUS
  Filled 2018-05-25: qty 1

## 2018-05-25 MED ORDER — ATORVASTATIN CALCIUM 20 MG PO TABS
80.0000 mg | ORAL_TABLET | Freq: Every day | ORAL | Status: DC
  Administered 2018-05-25: 80 mg via ORAL
  Filled 2018-05-25: qty 4

## 2018-05-25 MED ORDER — AMLODIPINE BESYLATE 10 MG PO TABS
10.0000 mg | ORAL_TABLET | Freq: Every day | ORAL | Status: DC
  Administered 2018-05-25 – 2018-05-26 (×2): 10 mg via ORAL
  Filled 2018-05-25 (×2): qty 1

## 2018-05-25 MED ORDER — SODIUM CHLORIDE 0.9 % WEIGHT BASED INFUSION
1.0000 mL/kg/h | INTRAVENOUS | Status: DC
  Administered 2018-05-26: 1 mL/kg/h via INTRAVENOUS

## 2018-05-25 MED ORDER — HEPARIN (PORCINE) 25000 UT/250ML-% IV SOLN
1000.0000 [IU]/h | INTRAVENOUS | Status: DC
  Administered 2018-05-25 – 2018-05-26 (×2): 1000 [IU]/h via INTRAVENOUS
  Filled 2018-05-25 (×2): qty 250

## 2018-05-25 MED ORDER — LOSARTAN POTASSIUM 50 MG PO TABS
50.0000 mg | ORAL_TABLET | Freq: Every day | ORAL | Status: DC
  Administered 2018-05-25 – 2018-05-26 (×2): 50 mg via ORAL
  Filled 2018-05-25 (×2): qty 1

## 2018-05-25 NOTE — ED Notes (Signed)
Patient transported to X-ray 

## 2018-05-25 NOTE — ED Provider Notes (Signed)
[facility] Emergency Department Provider Note  ____________________________________________  Time seen: Approximately 9:34 AM  I have reviewed the triage vital signs and the nursing notes.   HISTORY  Chief Complaint Chest Pain   HPI Tyler Morales is a 53 y.o. male with a history of CAD status post stent in 2017, chronic kidney disease, diabetes, hypertension, hyperlipidemia who presents for evaluation of chest pain.  Patient reports the pain woke him up from his sleep.  Sharp located substernally, associated with nausea and diaphoresis.  The pain lasted about 45 minutes and resolved without intervention.  Patient reports that this episode was identical to his prior heart attack in 2017.  Patient reports noncompliance with his Brilinta for at least a month.  He continues to smoke.  Patient uses cocaine with the last use being a week ago.  Currently he has no pain.  Past Medical History:  Diagnosis Date  . Cerebral aneurysm without rupture   . Chronic kidney disease   . Coronary artery disease   . Diabetes mellitus without complication (HCC)   . Hyperlipidemia   . Hypertension   . Myocardial infarction [facility](HCC)     Patient Active Problem List   Diagnosis Date Noted  . NSTEMI (non-ST elevated myocardial infarction) (HCC) 09/24/2015    Past Surgical History:  Procedure Laterality Date  . CARDIAC CATHETERIZATION Right 09/24/2015   Procedure: Left Heart Cath and Coronary Angiography;  Surgeon: Laurier NancyShaukat A Khan, MD;  Location: [facility] INVASIVE CV LAB;  Service: Cardiovascular;  Laterality: Right;  . CARDIAC CATHETERIZATION N/A 09/24/2015   Procedure: Coronary Stent Intervention;  Surgeon: Alwyn Peawayne D Callwood, MD;  Location: [facility] INVASIVE CV LAB;  Service: Cardiovascular;  Laterality: N/A;  . CORONARY ANGIOPLASTY     stent placement 6/17  . SHOULDER SURGERY Left     Prior to Admission medications   Medication Sig Start Date End Date Taking? Authorizing Provider    amLODipine (NORVASC) 10 MG tablet Take 10 mg by mouth daily.   Yes [provider]  aspirin EC 81 MG EC tablet Take 1 tablet (81 mg total) by mouth daily. 09/25/15  Yes Hower, Cletis Athensavid K, MD  atorvastatin (LIPITOR) 80 MG tablet Take 1 tablet (80 mg total) by mouth daily at 6 PM. 09/25/15  Yes Hower, Cletis Athensavid K, MD  carvedilol (COREG) 12.5 MG tablet Take 12.5 mg by mouth 2 (two) times daily. 07/12/16  Yes [provider]  empagliflozin (JARDIANCE) 25 MG TABS tablet Take 25 mg by mouth daily.   Yes [provider]  glimepiride (AMARYL) 4 MG tablet Take 4 mg by mouth daily with breakfast.   Yes [provider]  metFORMIN (GLUCOPHAGE) 500 MG tablet Take 1 tablet (500 mg total) by mouth 2 (two) times daily with a meal. 09/25/15  Yes Hower, Cletis Athensavid K, MD  ticagrelor (BRILINTA) 90 MG TABS tablet Take 1 tablet (90 mg total) by mouth 2 (two) times daily. 09/25/15  Yes Hower, Cletis Athensavid K, MD    Allergies Patient has no known allergies.  Family History  Problem Relation Age of Onset  . Diabetes Mellitus II Other     Social History Social History   Tobacco Use  . Smoking status: Current Every Day Smoker    Packs/day: 0.50    Types: Cigarettes  . Smokeless tobacco: Never Used  Substance Use Topics  . Alcohol use: Yes    Alcohol/week: 1.0 standard drinks    Types: 1 Cans of beer per week  Comment: weekly,none last 24hrs  . Drug use: Yes    Types: Marijuana    Comment: history of illicit drug use including cocaine, THC, benzos, and opiates    Review of Systems  Constitutional: Negative for fever. Eyes: Negative for visual changes. ENT: Negative for sore throat. Neck: No neck pain  Cardiovascular: + chest pain. Respiratory: Negative for shortness of breath. Gastrointestinal: Negative for abdominal pain, vomiting or diarrhea. Genitourinary: Negative for dysuria. Musculoskeletal: Negative for back pain. Skin: Negative for rash. Neurological: Negative for headaches,  weakness or numbness. Psych: No SI or HI  ____________________________________________   PHYSICAL EXAM:  VITAL SIGNS: ED Triage Vitals  Enc Vitals Group     BP 05/25/18 0842 (!) 151/94     Pulse Rate 05/25/18 0842 95     Resp 05/25/18 0842 16     Temp 05/25/18 0842 98.7 F (37.1 C)     Temp Source 05/25/18 0842 Oral     SpO2 05/25/18 0842 98 %     Weight 05/25/18 0843 184 lb (83.5 kg)     Height 05/25/18 0843 5\' 6"  (1.676 m)     Head Circumference --      Peak Flow --      Pain Score 05/25/18 0843 0     Pain Loc --      Pain Edu? --      Excl. in GC? --     Constitutional: Alert and oriented. Well appearing and in no apparent distress. HEENT:      Head: Normocephalic and atraumatic.         Eyes: Conjunctivae are normal. Sclera is non-icteric.       Mouth/Throat: Mucous membranes are moist.       Neck: Supple with no signs of meningismus. Cardiovascular: Regular rate and rhythm. No murmurs, gallops, or rubs. 2+ symmetrical distal pulses are present in all extremities. No JVD. Respiratory: Normal respiratory effort. Lungs are clear to auscultation bilaterally. No wheezes, crackles, or rhonchi.  Gastrointestinal: Soft, non tender, and non distended with positive bowel sounds. No rebound or guarding. Musculoskeletal: Nontender with normal range of motion in all extremities. No edema, cyanosis, or erythema of extremities. Neurologic: Normal speech and language. Face is symmetric. Moving all extremities. No gross focal neurologic deficits are appreciated. Skin: Skin is warm, dry and intact. No rash noted. Psychiatric: Mood and affect are normal. Speech and behavior are normal.  ____________________________________________   LABS (all labs ordered are listed, but only abnormal results are displayed)  Labs Reviewed  CBC WITH DIFFERENTIAL/PLATELET - Abnormal; Notable for the following components:      Result Value   WBC 12.6 (*)    Neutro Abs 9.7 (*)    All other  components within normal limits  BASIC METABOLIC PANEL - Abnormal; Notable for the following components:   Sodium 133 (*)    CO2 19 (*)    Glucose, Bld 278 (*)    All other components within normal limits  TROPONIN I - Abnormal; Notable for the following components:   Troponin I 0.07 (*)    All other components within normal limits  GLUCOSE, CAPILLARY - Abnormal; Notable for the following components:   Glucose-Capillary 356 (*)    All other components within normal limits  APTT  PROTIME-INR  LIPID PANEL  TROPONIN I  TROPONIN I  TROPONIN I  URINE DRUG SCREEN, QUALITATIVE ([facility] ONLY)  HEPARIN LEVEL (UNFRACTIONATED)  HEMOGLOBIN A1C   ____________________________________________  EKG  ED ECG REPORT I, [place]  Don Perking, the attending physician, personally viewed and interpreted this ECG.  Normal sinus rhythm, rate of 97, normal intervals, normal axis, no ST elevations or depressions. ____________________________________________  RADIOLOGY  I have personally reviewed the images performed during this visit and I agree with the Radiologist's read.   Interpretation by Radiologist:  Dg Chest 2 View  Result Date: 05/25/2018 CLINICAL DATA:  Chest pain. EXAM: CHEST - 2 VIEW COMPARISON:  Chest x-ray dated September 24, 2015. FINDINGS: The heart size and mediastinal contours are within normal limits. Both lungs are clear. The visualized skeletal structures are unremarkable. IMPRESSION: No active cardiopulmonary disease. Electronically Signed   By: Obie Dredge M.D.   On: 05/25/2018 09:39      ____________________________________________   PROCEDURES  Procedure(s) performed: None Procedures Critical Care performed: yes  CRITICAL CARE Performed by: Nita Sickle  ?  Total critical care time: 35 min  Critical care time was exclusive of separately billable procedures and treating other patients.  Critical care was necessary to treat or prevent imminent or life-threatening  deterioration.  Critical care was time spent personally by me on the following activities: development of treatment plan with patient and/or surrogate as well as nursing, discussions with consultants, evaluation of patient's response to treatment, examination of patient, obtaining history from patient or surrogate, ordering and performing treatments and interventions, ordering and review of laboratory studies, ordering and review of radiographic studies, pulse oximetry and re-evaluation of patient's condition.  ____________________________________________   INITIAL IMPRESSION / ASSESSMENT AND PLAN / ED COURSE   53 y.o. male with a history of CAD status post stent in 2017, chronic kidney disease, diabetes, hypertension, hyperlipidemia who presents for evaluation of chest pain. History of chest pain concerning for possible cardiac etiology. Exam is benign. EKG with no acute ischemic changes. First troponin 0.07 concerning for NSTEMI. Clinical picture and evaluation not suggestive of other serious cause including pneumonia, pulmonary embolism, pneumothorax, esophageal rupture, or aortic dissection. Patient will be admitted for chest pain evaluation. Spoke with his cardiologist Dr. Welton Flakes who will evaluate patient in house.   I discussed the diagnosis and plan of care with the patient and answered all his questions. He states understanding and is in agreement with admission for further work up.       As part of my medical decision making, I reviewed the following data within the electronic MEDICAL RECORD NUMBER Nursing notes reviewed and incorporated, Labs reviewed , EKG interpreted , Old EKG reviewed, Old chart reviewed, Radiograph reviewed , Discussed with admitting physician , A consult was requested and obtained from this/these consultant(s) Cardiology, Notes from prior ED visits and [place] Controlled Substance Database    Pertinent labs & imaging results that were available during my care of the patient  were reviewed by me and considered in my medical decision making (see chart for details).    ____________________________________________   FINAL CLINICAL IMPRESSION(S) / ED DIAGNOSES  Final diagnoses:  NSTEMI (non-ST elevated myocardial infarction) (HCC)      NEW MEDICATIONS STARTED DURING THIS VISIT:  ED Discharge Orders    None       Note:  This document was prepared using Dragon voice recognition software and may include unintentional dictation errors.    Don Perking, [place], MD 05/25/18 1500

## 2018-05-25 NOTE — ED Notes (Signed)
First Nurse Note: Patient EKG done, taken to Dr. Mayford Knife for review.  Dr. Mayford Knife asked that patient be placed in room. Stephaine RN aware.  Patient to go to Room 4.

## 2018-05-25 NOTE — Progress Notes (Signed)
ANTICOAGULATION CONSULT NOTE - Initial Consult  Pharmacy Consult for Heparin Indication: chest pain/ACS  No Known Allergies  Patient Measurements: Height: 5\' 6"  (167.6 cm) Weight: 184 lb (83.5 kg) IBW/kg (Calculated) : 63.8 Heparin Dosing Weight: 80.9 kg  Vital Signs: Temp: 98.7 F (37.1 C) (02/05 0842) Temp Source: Oral (02/05 0842) BP: 154/78 (02/05 1130) Pulse Rate: 86 (02/05 1145)  Labs: Recent Labs    05/25/18 0904  HGB 15.8  HCT 46.1  PLT 335  APTT 28  LABPROT 13.0  INR 0.99  CREATININE 1.04  TROPONINI 0.07*    Estimated Creatinine Clearance: 85.2 mL/min (by C-G formula based on SCr of 1.04 mg/dL).   Medical History: Past Medical History:  Diagnosis Date  . Cerebral aneurysm without rupture   . Chronic kidney disease   . Coronary artery disease   . Diabetes mellitus without complication (HCC)   . Hyperlipidemia   . Hypertension   . Myocardial infarction [facility])    Assessment: Patient is a 53 yo male admitted for NSTEMI. Pharmacy consulted for Heparin dosing. Reviewed prior to admission medications, no anticoagulants listed.   Goal of Therapy:  Heparin level 0.3-0.7 units/ml Monitor platelets by anticoagulation protocol: Yes   Plan:  Give 4000 units bolus x 1 Start heparin infusion at 1000 units/hr Check anti-Xa level in 6 hours and daily while on heparin Continue to monitor H&H and platelets  Clovia Cuff, PharmD, BCPS 05/25/2018 12:07 PM

## 2018-05-25 NOTE — ED Notes (Signed)
Meal tray has been giving to pt. Pt denies any pain at this time. Pt is A/oX4. NAD noted

## 2018-05-25 NOTE — ED Notes (Signed)
Date and time results received: 05/25/18 0943 (use smartphrase ".now" to insert current time)  Test: troponin Critical Value: 0.07  Name of Provider Notified: Don Perking  Orders Received? Or Actions Taken?: Orders Received - See Orders for details

## 2018-05-25 NOTE — Progress Notes (Signed)
Talked to the pharmacy about patient's brilinta, patient is on a heparin drip, per pharmacy ok to give. RN will continue to monitor.

## 2018-05-25 NOTE — Consult Note (Signed)
Tyler Morales is a 53 y.o. male  [id number]  Primary Cardiologist: Dr. Adrian BlackwaterShaukat Khan Reason for Consultation: Elevated troponin, chest pain, abnormal EKG   HPI: 53yo male with a past medical history of CAD with stent placed in RCA 09/2015, HTN, HLP, CKD and history non-compliance with medication presented to the ER with sharp chest pain and diaphoresis which lasted 45min and resolved without intervention. He reports this was similar to his last heart attack in 2017.  He also reports use of cocaine, last use 1 week ago. He is currently chest pain free.     Review of Systems: Feeling well, no recurrence of chest pain, no shortness of breath.   Past Medical History:  Diagnosis Date  . Cerebral aneurysm without rupture   . Chronic kidney disease   . Coronary artery disease   . Diabetes mellitus without complication (HCC)   . Hyperlipidemia   . Hypertension   . Myocardial infarction [facility](HCC)     (Not in a hospital admission)    . heparin  4,000 Units Intravenous Once    Infusions: . heparin      No Known Allergies  Social History   Socioeconomic History  . Marital status: Married    Spouse name: Not on file  . Number of children: Not on file  . Years of education: Not on file  . Highest education level: Not on file  Occupational History  . Not on file  Social Needs  . Financial resource strain: Not on file  . Food insecurity:    Worry: Not on file    Inability: Not on file  . Transportation needs:    Medical: Not on file    Non-medical: Not on file  Tobacco Use  . Smoking status: Current Every Day Smoker    Packs/day: 0.50    Types: Cigarettes  . Smokeless tobacco: Never Used  Substance and Sexual Activity  . Alcohol use: Yes    Alcohol/week: 1.0 standard drinks    Types: 1 Cans of beer per week    Comment: weekly,none last 24hrs  . Drug use: Yes    Types: Marijuana    Comment: history of illicit drug use including cocaine, THC, benzos, and opiates  .  Sexual activity: Not on file  Lifestyle  . Physical activity:    Days per week: Not on file    Minutes per session: Not on file  . Stress: Not on file  Relationships  . Social connections:    Talks on phone: Not on file    Gets together: Not on file    Attends religious service: Not on file    Active member of club or organization: Not on file    Attends meetings of clubs or organizations: Not on file    Relationship status: Not on file  . Intimate partner violence:    Fear of current or ex partner: Not on file    Emotionally abused: Not on file    Physically abused: Not on file    Forced sexual activity: Not on file  Other Topics Concern  . Not on file  Social History Narrative  . Not on file    Family History  Problem Relation Age of Onset  . Diabetes Mellitus II Other     PHYSICAL EXAM: Vitals:   05/25/18 0930 05/25/18 1000  BP: (!) 148/100 (!) 156/99  Pulse: 87 89  Resp: (!) 25 (!) 22  Temp:    SpO2: 96% 96%  No intake or output data in the 24 hours ending 05/25/18 1015  General:  Well appearing. No respiratory difficulty HEENT: normal Neck: supple. no JVD. Carotids 2+ bilat; no bruits. No lymphadenopathy or thryomegaly appreciated. Cor: PMI nondisplaced. Regular rate & rhythm. No rubs, gallops or murmurs. Lungs: clear Abdomen: soft, nontender, nondistended. No hepatosplenomegaly. No bruits or masses. Good bowel sounds. Extremities: no cyanosis, clubbing, rash, edema Neuro: alert & oriented x 3, cranial nerves grossly intact. moves all 4 extremities w/o difficulty. Affect pleasant.  ECG: NSR 97bpm, 9mm ST elevation inferior leads  Results for orders placed or performed during the hospital encounter of 05/25/18 (from the past 24 hour(s))  CBC with Differential/Platelet     Status: Abnormal   Collection Time: 05/25/18  9:04 AM  Result Value Ref Range   WBC 12.6 (H) 4.0 - 10.5 K/uL   RBC 5.30 4.22 - 5.81 MIL/uL   Hemoglobin 15.8 13.0 - 17.0 g/dL   HCT  79.0 24.0 - 97.3 %   MCV 87.0 80.0 - 100.0 fL   MCH 29.8 26.0 - 34.0 pg   MCHC 34.3 30.0 - 36.0 g/dL   RDW 53.2 99.2 - 42.6 %   Platelets 335 150 - 400 K/uL   nRBC 0.0 0.0 - 0.2 %   Neutrophils Relative % 77 %   Neutro Abs 9.7 (H) 1.7 - 7.7 K/uL   Lymphocytes Relative 17 %   Lymphs Abs 2.1 0.7 - 4.0 K/uL   Monocytes Relative 5 %   Monocytes Absolute 0.6 0.1 - 1.0 K/uL   Eosinophils Relative 1 %   Eosinophils Absolute 0.1 0.0 - 0.5 K/uL   Basophils Relative 0 %   Basophils Absolute 0.1 0.0 - 0.1 K/uL   Immature Granulocytes 0 %   Abs Immature Granulocytes 0.03 0.00 - 0.07 K/uL  Basic metabolic panel     Status: Abnormal   Collection Time: 05/25/18  9:04 AM  Result Value Ref Range   Sodium 133 (L) 135 - 145 mmol/L   Potassium 3.7 3.5 - 5.1 mmol/L   Chloride 100 98 - 111 mmol/L   CO2 19 (L) 22 - 32 mmol/L   Glucose, Bld 278 (H) 70 - 99 mg/dL   BUN 10 6 - 20 mg/dL   Creatinine, Ser 8.34 0.61 - 1.24 mg/dL   Calcium 9.2 8.9 - 19.6 mg/dL   GFR calc non Af Amer >60 >60 mL/min   GFR calc Af Amer >60 >60 mL/min   Anion gap 14 5 - 15  Troponin I - ONCE - STAT     Status: Abnormal   Collection Time: 05/25/18  9:04 AM  Result Value Ref Range   Troponin I 0.07 (HH) <0.03 ng/mL   Dg Chest 2 View  Result Date: 05/25/2018 CLINICAL DATA:  Chest pain. EXAM: CHEST - 2 VIEW COMPARISON:  Chest x-ray dated September 24, 2015. FINDINGS: The heart size and mediastinal contours are within normal limits. Both lungs are clear. The visualized skeletal structures are unremarkable. IMPRESSION: No active cardiopulmonary disease. Electronically Signed   By: Obie Dredge M.D.   On: 05/25/2018 09:39     ASSESSMENT AND PLAN:  Elevated troponin, ST elevation: Mildly elevated troponin, chest pain, 49mm ST elevation inferior leads. Will do cardiac cath Thursday 8:30am. NPO after midnight,  Orders placed.   CCTA 11/09/17: Quality of study: Good.  1-Calcium score: 404.1  2-Right dominant system  3-Distal RCA  stent has no significant restenosis, LAD and LCX normal.  Hypertension: Elevated blood  pressure, probably has been off his blood pressure medications. Start losartan and HCTZ. Continue to monitor.   Caroleen Hamman, NP-C Cell: [phone]

## 2018-05-25 NOTE — ED Notes (Signed)
Report given to Bill RN

## 2018-05-25 NOTE — ED Notes (Signed)
ED TO INPATIENT HANDOFF REPORT  Name/Age/Gender Valera CastleKeith D Droke [age] y.o. male  Code Status Code Status History    Date Active Date Inactive Code Status Order ID Comments User Context   09/24/2015 0611 09/25/2015 1535 Full Code [id number]  Arnaldo Nataliamond, Michael S, MD ED      Home/SNF/Other Home  Chief Complaint chest pain  Level of Care/Admitting Diagnosis ED Disposition    ED Disposition Condition Comment   Admit  [facility] [[id number]]  Level of Care: Telemetry [5]  Diagnosis: NSTEMI (non-ST elevated myocardial infarction) [facility](HCC) [[id number]][[id number]]  Admitting Physician: Altamese DillingVACHHANI, VAIBHAVKUMAR [phone][[id number]]  Attending Physician: Altamese DillingVACHHANI, VAIBHAVKUMAR [phone][[id number]]  Estimated length of stay: past midnight tomorrow  Certification:: I certify this patient will need inpatient services for at least 2 midnights  PT Class (Do Not Modify): Inpatient [101]  PT Acc Code (Do Not Modify): Private [1]       Medical History Past Medical History:  Diagnosis Date  . Cerebral aneurysm without rupture   . Chronic kidney disease   . Coronary artery disease   . Diabetes mellitus without complication (HCC)   . Hyperlipidemia   . Hypertension   . Myocardial infarction (HCC)     Allergies No Known Allergies  IV Location/Drains/Wounds Patient Lines/Drains/Airways Status   Active Line/Drains/Airways    Name:   Placement date:   Placement time:   Site:   Days:   Peripheral IV 05/25/18 Left Antecubital   05/25/18    0906    Antecubital   less than 1   Peripheral IV 05/25/18 Right Forearm   05/25/18    0954    Forearm   less than 1          Labs/Imaging Results for orders placed or performed during the hospital encounter of 05/25/18 (from the past 48 hour(s))  CBC with Differential/Platelet     Status: Abnormal   Collection Time: 05/25/18  9:04 AM  Result Value Ref Range   WBC 12.6 (H) 4.0 - 10.5 K/uL   RBC 5.30 4.22 - 5.81 MIL/uL   Hemoglobin 15.8 13.0 - 17.0 g/dL   HCT 96.246.1 95.239.0 - 84.152.0 %   MCV 87.0 80.0 - 100.0 fL   MCH 29.8 26.0 - 34.0 pg   MCHC 34.3 30.0 - 36.0 g/dL   RDW 32.412.6 40.111.5 - 02.715.5 %   Platelets 335 150 - 400 K/uL   nRBC 0.0 0.0 - 0.2 %   Neutrophils Relative % 77 %   Neutro Abs 9.7 (H) 1.7 - 7.7 K/uL   Lymphocytes Relative 17 %   Lymphs Abs 2.1 0.7 - 4.0 K/uL   Monocytes Relative 5 %   Monocytes Absolute 0.6 0.1 - 1.0 K/uL   Eosinophils Relative 1 %   Eosinophils Absolute 0.1 0.0 - 0.5 K/uL   Basophils Relative 0 %   Basophils Absolute 0.1 0.0 - 0.1 K/uL   Immature Granulocytes 0 %   Abs Immature Granulocytes 0.03 0.00 - 0.07 K/uL    Comment: Performed at [facility], [address] [place], [place] [address]  Basic metabolic panel     Status: Abnormal   Collection Time: 05/25/18  9:04 AM  Result Value Ref Range   Sodium 133 (L) 135 - 145 mmol/L   Potassium 3.7 3.5 - 5.1 mmol/L   Chloride 100 98 - 111 mmol/L   CO2 19 (L) 22 - 32 mmol/L   Glucose, Bld 278 (H) 70 - 99 mg/dL   BUN 10 6 -  20 mg/dL   Creatinine, Ser 0.16 0.61 - 1.24 mg/dL   Calcium 9.2 8.9 - 01.0 mg/dL   GFR calc non Af Amer >60 >60 mL/min   GFR calc Af Amer >60 >60 mL/min   Anion gap 14 5 - 15    Comment: Performed at [facility], [address] [place], [place] [address]  Troponin I - ONCE - STAT     Status: Abnormal   Collection Time: 05/25/18  9:04 AM  Result Value Ref Range   Troponin I 0.07 (HH) <0.03 ng/mL    Comment: CRITICAL RESULT CALLED TO, READ BACK BY AND VERIFIED WITH  [facility] HAMILTON AT 0945 05/25/18 SDR Performed at [facility], [address] [place], [place] [address]   APTT     Status: None   Collection Time: 05/25/18  9:04 AM  Result Value Ref Range   aPTT 28 24 - 36 seconds    Comment: Performed at [facility], [address] [place], [place] [address]  Protime-INR     Status: None   Collection Time: 05/25/18  9:04 AM  Result Value Ref Range   Prothrombin Time 13.0 11.4 - 15.2 seconds    INR 0.99     Comment: Performed at [facility], [address] [place], [place] [address]  Glucose, capillary     Status: Abnormal   Collection Time: 05/25/18  1:05 PM  Result Value Ref Range   Glucose-Capillary 356 (H) 70 - 99 mg/dL   Comment 1 Notify RN   Lipid panel     Status: Abnormal   Collection Time: 05/25/18  2:18 PM  Result Value Ref Range   Cholesterol 209 (H) 0 - 200 mg/dL   Triglycerides 762 (H) <150 mg/dL   HDL 35 (L) >83 mg/dL   Total CHOL/HDL Ratio 6.0 RATIO   VLDL 56 (H) 0 - 40 mg/dL   LDL Cholesterol 151 (H) 0 - 99 mg/dL    Comment:        Total Cholesterol/HDL:CHD Risk Coronary Heart Disease Risk Table                     Men   Women  1/2 Average Risk   3.4   3.3  Average Risk       5.0   4.4  2 X Average Risk   9.6   7.1  3 X Average Risk  23.4   11.0        Use the calculated Patient Ratio above and the CHD Risk Table to determine the patient's CHD Risk.        ATP III CLASSIFICATION (LDL):  <100     mg/dL   Optimal  761-607  mg/dL   Near or Above                    Optimal  130-159  mg/dL   Borderline  371-062  mg/dL   High  >694     mg/dL   Very High Performed at [facility], [address] [place], [place] [address]   Troponin I - Now Then Q6H     Status: Abnormal   Collection Time: 05/25/18  2:18 PM  Result Value Ref Range   Troponin I 4.45 (HH) <0.03 ng/mL    Comment: CRITICAL RESULT CALLED TO, READ BACK BY AND VERIFIED WITH PHIL SMITH AT 1517 ON 05/25/2018 SMA/KLW Performed at [facility], [address] [place],  [place] [address]   Urine Drug Screen, Qualitative ([facility] only)     Status: Abnormal   Collection Time: 05/25/18  2:18 PM  Result Value Ref Range   Tricyclic, Ur Screen NONE DETECTED NONE DETECTED   Amphetamines, Ur Screen NONE DETECTED NONE DETECTED   MDMA (Ecstasy)Ur Screen NONE DETECTED NONE DETECTED   Cocaine Metabolite,Ur [place] POSITIVE (A) NONE DETECTED   Opiate, Ur Screen NONE DETECTED NONE  DETECTED   Phencyclidine (PCP) Ur S NONE DETECTED NONE DETECTED   Cannabinoid 50 Ng, Ur  POSITIVE (A) NONE DETECTED   Barbiturates, Ur Screen NONE DETECTED NONE DETECTED   Benzodiazepine, Ur Scrn NONE DETECTED NONE DETECTED   Methadone Scn, Ur NONE DETECTED NONE DETECTED    Comment: (NOTE) Tricyclics + metabolites, urine    Cutoff 1000 ng/mL Amphetamines + metabolites, urine  Cutoff 1000 ng/mL MDMA (Ecstasy), urine              Cutoff 500 ng/mL Cocaine Metabolite, urine          Cutoff 300 ng/mL Opiate + metabolites, urine        Cutoff 300 ng/mL Phencyclidine (PCP), urine         Cutoff 25 ng/mL Cannabinoid, urine                 Cutoff 50 ng/mL Barbiturates + metabolites, urine  Cutoff 200 ng/mL Benzodiazepine, urine              Cutoff 200 ng/mL Methadone, urine                   Cutoff 300 ng/mL The urine drug screen provides only a preliminary, unconfirmed analytical test result and should not be used for non-medical purposes. Clinical consideration and professional judgment should be applied to any positive drug screen result due to possible interfering substances. A more specific alternate chemical method must be used in order to obtain a confirmed analytical result. Gas chromatography / mass spectrometry (GC/MS) is the preferred confirmat ory method. Performed at [facility], [address] [place], [place] [address]    Dg Chest 2 View  Result Date: 05/25/2018 CLINICAL DATA:  Chest pain. EXAM: CHEST - 2 VIEW COMPARISON:  Chest x-ray dated September 24, 2015. FINDINGS: The heart size and mediastinal contours are within normal limits. Both lungs are clear. The visualized skeletal structures are unremarkable. IMPRESSION: No active cardiopulmonary disease. Electronically Signed   By: Obie DredgeWilliam T Derry M.D.   On: 05/25/2018 09:39    Pending Labs Unresulted Labs (From admission, onward)    Start     Ordered   05/25/18 1600  Heparin level (unfractionated)  Once-Timed,    STAT     05/25/18 1202   05/25/18 1440  Hemoglobin A1c  Once,   R     05/25/18 1440   05/25/18 1059  Troponin I - Now Then Q6H  Now then every 6 hours,   STAT     05/25/18 1058   Signed and Held  HIV antibody (Routine Testing)  Once,   R     Signed and Held   Signed and Held  Basic metabolic panel  Tomorrow morning,   R     Signed and Held   Signed and Held  CBC  Tomorrow morning,   R     Signed and Held          Vitals/Pain Today's Vitals   05/25/18 1300 05/25/18 1330 05/25/18 1400 05/25/18 1428  BP: (!) 154/118 Marland Kitchen(!)  192/130 (!) 180/104   Pulse: 85  85   Resp: 19 (!) 23 17   Temp:      TempSrc:      SpO2: 97%  97%   Weight:      Height:      PainSc:    0-No pain    Isolation Precautions No active isolations  Medications Medications  heparin ADULT infusion 100 units/mL (25000 units/227mL sodium chloride 0.45%) (1,000 Units/hr Intravenous New Bag/Given 05/25/18 1018)  insulin aspart (novoLOG) injection 0-9 Units (9 Units Subcutaneous Given 05/25/18 1318)  insulin aspart (novoLOG) injection 0-5 Units (has no administration in time range)  losartan (COZAAR) tablet 50 mg (50 mg Oral Given 05/25/18 1420)  hydrochlorothiazide (MICROZIDE) capsule 12.5 mg (12.5 mg Oral Given 05/25/18 1420)  aspirin chewable tablet 324 mg (324 mg Oral Given 05/25/18 0918)  heparin bolus via infusion 4,000 Units (4,000 Units Intravenous Bolus from Bag 05/25/18 1019)    Mobility walks

## 2018-05-25 NOTE — ED Notes (Signed)
Dr. Elisabeth Pigeon notified in person of pt's troponin 4.45. no new orders.

## 2018-05-25 NOTE — ED Triage Notes (Signed)
Patient reports waking up this morning with pain in center of chest and diaphoresis as well as one episode of vomiting. Patient with history of MI 3 years ago. Patient reports pain has eased off at this time.

## 2018-05-25 NOTE — H&P (Signed)
[employer] - [facility]   PATIENT NAME: Tyler Morales    MR#:  [id number]  DATE OF BIRTH:  15-Jan-1966  DATE OF ADMISSION:  05/25/2018  PRIMARY CARE PHYSICIAN: Margaretann LovelessKhan, Neelam S, MD   REQUESTING/REFERRING PHYSICIAN: Don PerkingVeronese  CHIEF COMPLAINT:   Chief Complaint  Patient presents with  . Chest Pain    HISTORY OF PRESENT ILLNESS: Tyler Morales  is a 53 y.o. male with a known history of cerebral aneurysm, chronic kidney disease, coronary artery disease, diabetes without complication, hyperlipidemia, hypertension, myocardial infarction with stent in the past.  He is not very compliant with his medications.  His father-in-law died last weekend and funeral is tomorrow.  He is going through stress because of this as per wife.  Today morning he woke up from sleep with significant sharp chest pain in the center of his chest which was nonradiating.  He got concerned and took some brilianta, his pain subsides in 10 to 15 minutes.  Concerned with this he came to emergency room.  Noted to have slightly elevated troponin.  There were no new EKG changes.  ER physician spoke to his primary cardiologist Dr. Adrian BlackwaterShaukat Khan, he suggested to start him on heparin IV drip for non-ST elevation MI and he will do cardiac catheterization tomorrow.  PAST MEDICAL HISTORY:   Past Medical History:  Diagnosis Date  . Cerebral aneurysm without rupture   . Chronic kidney disease   . Coronary artery disease   . Diabetes mellitus without complication (HCC)   . Hyperlipidemia   . Hypertension   . Myocardial infarction [facility](HCC)     PAST SURGICAL HISTORY:  Past Surgical History:  Procedure Laterality Date  . CARDIAC CATHETERIZATION Right 09/24/2015   Procedure: Left Heart Cath and Coronary Angiography;  Surgeon: Laurier NancyShaukat A Khan, MD;  Location: [facility] INVASIVE CV LAB;  Service: Cardiovascular;  Laterality: Right;  . CARDIAC CATHETERIZATION N/A 09/24/2015   Procedure: Coronary Stent Intervention;   Surgeon: Alwyn Peawayne D Callwood, MD;  Location: [facility] INVASIVE CV LAB;  Service: Cardiovascular;  Laterality: N/A;  . CORONARY ANGIOPLASTY     stent placement 6/17  . SHOULDER SURGERY Left     SOCIAL HISTORY:  Social History   Tobacco Use  . Smoking status: Current Every Day Smoker    Packs/day: 0.50    Types: Cigarettes  . Smokeless tobacco: Never Used  Substance Use Topics  . Alcohol use: Yes    Alcohol/week: 1.0 standard drinks    Types: 1 Cans of beer per week    Comment: weekly,none last 24hrs    FAMILY HISTORY:  Family History  Problem Relation Age of Onset  . Diabetes Mellitus II Other     DRUG ALLERGIES: No Known Allergies  REVIEW OF SYSTEMS:   CONSTITUTIONAL: No fever, fatigue or weakness.  EYES: No blurred or double vision.  EARS, NOSE, AND THROAT: No tinnitus or ear pain.  RESPIRATORY: No cough, shortness of breath, wheezing or hemoptysis.  CARDIOVASCULAR: Have chest pain, no orthopnea, edema.  GASTROINTESTINAL: No nausea, vomiting, diarrhea or abdominal pain.  GENITOURINARY: No dysuria, hematuria.  ENDOCRINE: No polyuria, nocturia,  HEMATOLOGY: No anemia, easy bruising or bleeding SKIN: No rash or lesion. MUSCULOSKELETAL: No joint pain or arthritis.   NEUROLOGIC: No tingling, numbness, weakness.  PSYCHIATRY: No anxiety or depression.   MEDICATIONS AT HOME:  Prior to Admission medications   Medication Sig Start Date End Date Taking? Authorizing Provider  amLODipine (NORVASC) 10 MG tablet Take 10 mg by mouth daily.  Yes [provider]  aspirin EC 81 MG EC tablet Take 1 tablet (81 mg total) by mouth daily. 09/25/15  Yes Hower, Cletis Athens, MD  atorvastatin (LIPITOR) 80 MG tablet Take 1 tablet (80 mg total) by mouth daily at 6 PM. 09/25/15  Yes Hower, Cletis Athens, MD  carvedilol (COREG) 12.5 MG tablet Take 12.5 mg by mouth 2 (two) times daily. 07/12/16  Yes [provider]  empagliflozin (JARDIANCE) 25 MG TABS tablet Take 25 mg by mouth daily.   Yes  [provider]  glimepiride (AMARYL) 4 MG tablet Take 4 mg by mouth daily with breakfast.   Yes [provider]  metFORMIN (GLUCOPHAGE) 500 MG tablet Take 1 tablet (500 mg total) by mouth 2 (two) times daily with a meal. 09/25/15  Yes Hower, Cletis Athens, MD  ticagrelor (BRILINTA) 90 MG TABS tablet Take 1 tablet (90 mg total) by mouth 2 (two) times daily. 09/25/15  Yes Hower, Cletis Athens, MD      PHYSICAL EXAMINATION:   VITAL SIGNS: Blood pressure (!) 156/99, pulse 89, temperature 98.7 F (37.1 C), temperature source Oral, resp. rate (!) 22, height 5\' 6"  (1.676 m), weight 83.5 kg, SpO2 96 %.  GENERAL:  53 y.o.-year-old patient lying in the bed with no acute distress.  EYES: Pupils equal, round, reactive to light and accommodation. No scleral icterus. Extraocular muscles intact.  HEENT: Head atraumatic, normocephalic. Oropharynx and nasopharynx clear.  NECK:  Supple, no jugular venous distention. No thyroid enlargement, no tenderness.  LUNGS: Normal breath sounds bilaterally, no wheezing, rales,rhonchi or crepitation. No use of accessory muscles of respiration.  CARDIOVASCULAR: S1, S2 normal. No murmurs, rubs, or gallops.  ABDOMEN: Soft, nontender, nondistended. Bowel sounds present. No organomegaly or mass.  EXTREMITIES: No pedal edema, cyanosis, or clubbing.  NEUROLOGIC: Cranial nerves II through XII are intact. Muscle strength 5/5 in all extremities. Sensation intact. Gait not checked.  PSYCHIATRIC: The patient is alert and oriented x 3.  SKIN: No obvious rash, lesion, or ulcer.   LABORATORY PANEL:   CBC Recent Labs  Lab 05/25/18 0904  WBC 12.6*  HGB 15.8  HCT 46.1  PLT 335  MCV 87.0  MCH 29.8  MCHC 34.3  RDW 12.6  LYMPHSABS 2.1  MONOABS 0.6  EOSABS 0.1  BASOSABS 0.1   ------------------------------------------------------------------------------------------------------------------  Chemistries  Recent Labs  Lab 05/25/18 0904  NA 133*  K 3.7  CL 100  CO2  19*  GLUCOSE 278*  BUN 10  CREATININE 1.04  CALCIUM 9.2   ------------------------------------------------------------------------------------------------------------------ estimated creatinine clearance is 85.2 mL/min (by C-G formula based on SCr of 1.04 mg/dL). ------------------------------------------------------------------------------------------------------------------ No results for input(s): TSH, T4TOTAL, T3FREE, THYROIDAB in the last 72 hours.  Invalid input(s): FREET3   Coagulation profile Recent Labs  Lab 05/25/18 0904  INR 0.99   ------------------------------------------------------------------------------------------------------------------- No results for input(s): DDIMER in the last 72 hours. -------------------------------------------------------------------------------------------------------------------  Cardiac Enzymes Recent Labs  Lab 05/25/18 0904  TROPONINI 0.07*   ------------------------------------------------------------------------------------------------------------------ Invalid input(s): POCBNP  ---------------------------------------------------------------------------------------------------------------  Urinalysis No results found for: COLORURINE, APPEARANCEUR, LABSPEC, PHURINE, GLUCOSEU, HGBUR, BILIRUBINUR, KETONESUR, PROTEINUR, UROBILINOGEN, NITRITE, LEUKOCYTESUR   RADIOLOGY: Dg Chest 2 View  Result Date: 05/25/2018 CLINICAL DATA:  Chest pain. EXAM: CHEST - 2 VIEW COMPARISON:  Chest x-ray dated September 24, 2015. FINDINGS: The heart size and mediastinal contours are within normal limits. Both lungs are clear. The visualized skeletal structures are unremarkable. IMPRESSION: No active cardiopulmonary disease. Electronically Signed   By: Obie Dredge M.D.   On: 05/25/2018  09:39    EKG: Orders placed or performed during the hospital encounter of 05/25/18  . EKG 12-Lead  . EKG 12-Lead    IMPRESSION AND PLAN: * Non-ST elevation  MI Monitor on telemetry, follow serial troponin. Check lipid panel and hemoglobin A1c. Continue cardiac medications for now. Patient is not very compliant with medication as outpatient. Heparin IV drip as advised by cardiologist. Cardiac catheterization is planned for tomorrow. Echocardiogram needed should be decided by primary cardiologist if he had not done recently in office.  *Diabetes mellitus without complications Hold oral medications and keep on insulin sliding scale coverage.  *Hypertension Continue carvedilol and amlodipine as home doses. Need for ACE inhibitors to be decided by cardiologist.  *Hyperlipidemia Continue atorvastatin.  *Active smoking Counseled to quit smoking for 4 minutes and offered nicotine patch.  *Drug abuse He claims he is using some drugs last week.  I will check urine toxicology.  All the records are reviewed and case discussed with ED provider. Management plans discussed with the patient, family and they are in agreement.  CODE STATUS: Full code. Code Status History    Date Active Date Inactive Code Status Order ID Comments User Context   09/24/2015 0611 09/25/2015 1535 Full Code [id number]  Arnaldo Nataliamond, Michael S, MD ED     Patient's wife was present in the room during my visit.  TOTAL TIME TAKING CARE OF THIS PATIENT: 50 minutes.    Altamese DillingVaibhavkumar Koraima Albertsen M.D on 05/25/2018   Between 7am to 6pm - Pager - [phone]  After 6pm go to [email or web address] - password [email or web address]  [facility]  Office  [phone]  CC: Primary care physician; Margaretann LovelessKhan, Neelam S, MD   Note: This dictation was prepared with Dragon dictation along with smaller phrase technology. Any transcriptional errors that result from this process are unintentional.

## 2018-05-25 NOTE — Progress Notes (Signed)
ANTICOAGULATION CONSULT NOTE - Initial Consult  Pharmacy Consult for Heparin Indication: chest pain/ACS  No Known Allergies  Patient Measurements: Height: 5\' 5"  (165.1 cm) Weight: 184 lb (83.5 kg) IBW/kg (Calculated) : 61.5 Heparin Dosing Weight: 80.9 kg  Vital Signs: Temp: 98.7 F (37.1 C) (02/05 1703) Temp Source: Oral (02/05 1703) BP: 155/100 (02/05 1703) Pulse Rate: 95 (02/05 1703)  Labs: Recent Labs    05/25/18 0904 05/25/18 1418 05/25/18 1723  HGB 15.8  --   --   HCT 46.1  --   --   PLT 335  --   --   APTT 28  --   --   LABPROT 13.0  --   --   INR 0.99  --   --   HEPARINUNFRC  --   --  0.33  CREATININE 1.04  --   --   TROPONINI 0.07* 4.45*  --     Estimated Creatinine Clearance: 83.6 mL/min (by C-G formula based on SCr of 1.04 mg/dL).   Medical History: Past Medical History:  Diagnosis Date  . Cerebral aneurysm without rupture   . Chronic kidney disease   . Coronary artery disease   . Diabetes mellitus without complication (HCC)   . Hyperlipidemia   . Hypertension   . Myocardial infarction [facility])    Assessment: Patient is a 53 yo male admitted for NSTEMI. Pharmacy consulted for Heparin dosing. Reviewed prior to admission medications, no anticoagulants listed.   Goal of Therapy:  Heparin level 0.3-0.7 units/ml Monitor platelets by anticoagulation protocol: Yes   Plan:  2/5 1723 HL 0.33 therapeutic. Continue heparin drip at 1000 units/hr. HL ordered for 2300 to confirm. CBC with morning labs.  Pricilla Riffle, PharmD Pharmacy Resident  05/25/2018 6:00 PM

## 2018-05-25 NOTE — ED Notes (Signed)
Called lab and spoke with [facility] to add on PT/APTT to blood already in lab

## 2018-05-26 ENCOUNTER — Encounter: Payer: Self-pay | Admitting: *Deleted

## 2018-05-26 ENCOUNTER — Encounter
Admission: EM | Disposition: A | Discharge status: Home / Self Care | Payer: Self-pay | Source: Home / Self Care | Attending: Internal Medicine

## 2018-05-26 HISTORY — PX: LEFT HEART CATH AND CORONARY ANGIOGRAPHY: CATH118249

## 2018-05-26 LAB — CBC
HCT: 44.1 % (ref 39.0–52.0)
Hemoglobin: 15.1 g/dL (ref 13.0–17.0)
MCH: 29.8 pg (ref 26.0–34.0)
MCHC: 34.2 g/dL (ref 30.0–36.0)
MCV: 87.2 fL (ref 80.0–100.0)
PLATELETS: 324 10*3/uL (ref 150–400)
RBC: 5.06 MIL/uL (ref 4.22–5.81)
RDW: 12.9 % (ref 11.5–15.5)
WBC: 8.7 10*3/uL (ref 4.0–10.5)
nRBC: 0 % (ref 0.0–0.2)

## 2018-05-26 LAB — BASIC METABOLIC PANEL
Anion gap: 10 (ref 5–15)
BUN: 21 mg/dL — AB (ref 6–20)
CO2: 23 mmol/L (ref 22–32)
Calcium: 9.2 mg/dL (ref 8.9–10.3)
Chloride: 99 mmol/L (ref 98–111)
Creatinine, Ser: 1.03 mg/dL (ref 0.61–1.24)
GFR calc Af Amer: >60 mL/min (ref >60)
GFR calc non Af Amer: >60 mL/min (ref >60)
GLUCOSE: 350 mg/dL — AB (ref 70–99)
Potassium: 3.7 mmol/L (ref 3.5–5.1)
Sodium: 132 mmol/L — ABNORMAL LOW (ref 135–145)

## 2018-05-26 LAB — GLUCOSE, CAPILLARY
Glucose-Capillary: 332 mg/dL — ABNORMAL HIGH (ref 70–99)
Glucose-Capillary: 244 mg/dL — ABNORMAL HIGH (ref 70–99)

## 2018-05-26 LAB — HEPARIN LEVEL (UNFRACTIONATED): Heparin Unfractionated: 0.49 IU/mL (ref 0.30–0.70)

## 2018-05-26 SURGERY — LEFT HEART CATH AND CORONARY ANGIOGRAPHY
Anesthesia: Moderate Sedation | Laterality: Left

## 2018-05-26 MED ORDER — ISOSORBIDE MONONITRATE ER 30 MG PO TB24
30.0000 mg | ORAL_TABLET | Freq: Every day | ORAL | Status: DC
  Filled 2018-05-26: qty 1

## 2018-05-26 MED ORDER — MIDAZOLAM HCL 2 MG/2ML IJ SOLN
INTRAMUSCULAR | Status: AC
  Filled 2018-05-26: qty 2

## 2018-05-26 MED ORDER — ONDANSETRON HCL 4 MG/2ML IJ SOLN: 4.0000 mg | Freq: Four times a day (QID) | INTRAMUSCULAR | Status: DC | PRN

## 2018-05-26 MED ORDER — SODIUM CHLORIDE 0.9% FLUSH: 3.0000 mL | Freq: Two times a day (BID) | INTRAVENOUS | Status: DC

## 2018-05-26 MED ORDER — HEPARIN SODIUM (PORCINE) 1000 UNIT/ML IJ SOLN
INTRAMUSCULAR | Status: AC
  Filled 2018-05-26: qty 1

## 2018-05-26 MED ORDER — INSULIN ASPART 100 UNIT/ML ~~LOC~~ SOLN
SUBCUTANEOUS | Status: AC
  Filled 2018-05-26: qty 1

## 2018-05-26 MED ORDER — CLOPIDOGREL BISULFATE 75 MG PO TABS: 75.0000 mg | ORAL_TABLET | Freq: Every day | ORAL | Status: DC

## 2018-05-26 MED ORDER — HEPARIN SODIUM (PORCINE) 1000 UNIT/ML IJ SOLN
INTRAMUSCULAR | Status: DC | PRN
  Administered 2018-05-26: 5000 [IU] via INTRAVENOUS

## 2018-05-26 MED ORDER — IOPAMIDOL (ISOVUE-300) INJECTION 61%
INTRAVENOUS | Status: DC | PRN
  Administered 2018-05-26: 120 mL via INTRAVENOUS

## 2018-05-26 MED ORDER — FENTANYL CITRATE (PF) 100 MCG/2ML IJ SOLN
INTRAMUSCULAR | Status: AC
  Filled 2018-05-26: qty 2

## 2018-05-26 MED ORDER — HEPARIN (PORCINE) IN NACL 1000-0.9 UT/500ML-% IV SOLN
INTRAVENOUS | Status: AC
  Filled 2018-05-26: qty 1000

## 2018-05-26 MED ORDER — SODIUM CHLORIDE 0.9 % WEIGHT BASED INFUSION: 1.0000 mL/kg/h | INTRAVENOUS | Status: DC

## 2018-05-26 MED ORDER — FENTANYL CITRATE (PF) 100 MCG/2ML IJ SOLN
INTRAMUSCULAR | Status: DC | PRN
  Administered 2018-05-26: 25 ug via INTRAVENOUS

## 2018-05-26 MED ORDER — MIDAZOLAM HCL 2 MG/2ML IJ SOLN
INTRAMUSCULAR | Status: DC | PRN
  Administered 2018-05-26: 1 mg via INTRAVENOUS

## 2018-05-26 MED ORDER — VERAPAMIL HCL 2.5 MG/ML IV SOLN
INTRAVENOUS | Status: AC
  Filled 2018-05-26: qty 2

## 2018-05-26 MED ORDER — SODIUM CHLORIDE 0.9% FLUSH: 3.0000 mL | INTRAVENOUS | Status: DC | PRN

## 2018-05-26 MED ORDER — HEPARIN (PORCINE) IN NACL 1000-0.9 UT/500ML-% IV SOLN
INTRAVENOUS | Status: DC | PRN
  Administered 2018-05-26: 500 mL

## 2018-05-26 MED ORDER — ISOSORBIDE MONONITRATE ER 30 MG PO TB24: 30.0000 mg | ORAL_TABLET | Freq: Every day | ORAL | 0 refills | Status: DC

## 2018-05-26 MED ORDER — VERAPAMIL HCL 2.5 MG/ML IV SOLN
INTRAVENOUS | Status: DC | PRN
  Administered 2018-05-26: 2.5 mg via INTRAVENOUS

## 2018-05-26 MED ORDER — ACETAMINOPHEN 325 MG PO TABS: 650.0000 mg | ORAL_TABLET | ORAL | Status: DC | PRN

## 2018-05-26 MED ORDER — SODIUM CHLORIDE 0.9 % IV SOLN: 250.0000 mL | INTRAVENOUS | Status: DC | PRN

## 2018-05-26 MED ORDER — TICAGRELOR 90 MG PO TABS: 90.0000 mg | ORAL_TABLET | Freq: Two times a day (BID) | ORAL | 0 refills | Status: DC

## 2018-05-26 SURGICAL SUPPLY — 8 items
CATH INFINITI 5 FR JL3.5 (CATHETERS) ×2 IMPLANT
CATH INFINITI 5FR ANG PIGTAIL (CATHETERS) ×2 IMPLANT
CATH INFINITI JR4 5F (CATHETERS) ×2 IMPLANT
DEVICE RAD TR BAND REGULAR (VASCULAR PRODUCTS) ×2 IMPLANT
GLIDESHEATH SLEND SS 6F .021 (SHEATH) ×2 IMPLANT
KIT MANI 3VAL PERCEP (MISCELLANEOUS) ×2 IMPLANT
PACK CARDIAC CATH (CUSTOM PROCEDURE TRAY) ×2 IMPLANT
WIRE ROSEN-J .035X260CM (WIRE) ×2 IMPLANT

## 2018-05-26 NOTE — Progress Notes (Signed)
SUBJECTIVE: Patient denies any chest pain   Vitals:   05/26/18 1121 05/26/18 1222 05/26/18 1300 05/26/18 1315  BP: 128/80  116/79 (!) 129/98  Pulse: 60  60 77  Resp: 16  12 20   Temp: 98.3 F (36.8 C)     TempSrc: Oral     SpO2: 96% 100% 97% 97%  Weight: 81.6 kg     Height: 5\' 5"  (1.651 m)       Intake/Output Summary (Last 24 hours) at 05/26/2018 1338 Last data filed at 05/26/2018 0536 Gross per 24 hour  Intake 406.14 ml  Output 650 ml  Net -243.86 ml    LABS: Basic Metabolic Panel: Recent Labs    05/25/18 0904 05/26/18 0317  NA 133* 132*  K 3.7 3.7  CL 100 99  CO2 19* 23  GLUCOSE 278* 350*  BUN 10 21*  CREATININE 1.04 1.03  CALCIUM 9.2 9.2   Liver Function Tests: No results for input(s): AST, ALT, ALKPHOS, BILITOT, PROT, ALBUMIN in the last 72 hours. No results for input(s): LIPASE, AMYLASE in the last 72 hours. CBC: Recent Labs    05/25/18 0904 05/26/18 0317  WBC 12.6* 8.7  NEUTROABS 9.7*  --   HGB 15.8 15.1  HCT 46.1 44.1  MCV 87.0 87.2  PLT 335 324   Cardiac Enzymes: Recent Labs    05/25/18 0904 05/25/18 1418 05/25/18 2259  TROPONINI 0.07* 4.45* 3.67*   BNP: Invalid input(s): POCBNP D-Dimer: No results for input(s): DDIMER in the last 72 hours. Hemoglobin A1C: Recent Labs    05/25/18 1622  HGBA1C 13.5*   Fasting Lipid Panel: Recent Labs    05/25/18 1418  CHOL 209*  HDL 35*  LDLCALC 118*  TRIG 282*  CHOLHDL 6.0   Thyroid Function Tests: No results for input(s): TSH, T4TOTAL, T3FREE, THYROIDAB in the last 72 hours.  Invalid input(s): FREET3 Anemia Panel: No results for input(s): VITAMINB12, FOLATE, FERRITIN, TIBC, IRON, RETICCTPCT in the last 72 hours.   PHYSICAL EXAM General: Well developed, well nourished, in no acute distress HEENT:  Normocephalic and atramatic Neck:  No JVD.  Lungs: Clear bilaterally to auscultation and percussion. Heart: HRRR . Normal S1 and S2 without gallops or murmurs.  Abdomen: Bowel sounds are  positive, abdomen soft and non-tender  Msk:  Back normal, normal gait. Normal strength and tone for age. Extremities: No clubbing, cyanosis or edema.   Neuro: Alert and oriented X 3. Psych:  Good affect, responds appropriately  TELEMETRY: Sinus rhythm   ASSESSMENT AND PLAN: Non-STEMI and moderate disease in mid LAD as well as distal LAD and distal PDA with normal left ventricular systolic function.  Advise adding Brilinta and isosorbide and can be discharged since cardiac catheterization had nonobstructive coronary artery disease with follow-up Monday at 10 AM.  Active Problems:   NSTEMI (non-ST elevated myocardial infarction) (HCC)    Laurier Nancy, MD, [facility] 05/26/2018 1:38 PM

## 2018-05-26 NOTE — Progress Notes (Addendum)
ANTICOAGULATION CONSULT NOTE - Initial Consult  Pharmacy Consult for Heparin Indication: chest pain/ACS  No Known Allergies  Patient Measurements: Height: 5\' 5"  (165.1 cm) Weight: 184 lb (83.5 kg) IBW/kg (Calculated) : 61.5 Heparin Dosing Weight: 80.9 kg  Vital Signs: Temp: 98.6 F (37 C) (02/05 2029) Temp Source: Oral (02/05 2029) BP: 149/94 (02/05 2029) Pulse Rate: 79 (02/05 2029)  Labs: Recent Labs    05/25/18 0904 05/25/18 1418 05/25/18 1723 05/25/18 2259  HGB 15.8  --   --   --   HCT 46.1  --   --   --   PLT 335  --   --   --   APTT 28  --   --   --   LABPROT 13.0  --   --   --   INR 0.99  --   --   --   HEPARINUNFRC  --   --  0.33 0.39  CREATININE 1.04  --   --   --   TROPONINI 0.07* 4.45*  --  3.67*    Estimated Creatinine Clearance: 83.6 mL/min (by C-G formula based on SCr of 1.04 mg/dL).   Medical History: Past Medical History:  Diagnosis Date  . Cerebral aneurysm without rupture   . Chronic kidney disease   . Coronary artery disease   . Diabetes mellitus without complication (HCC)   . Hyperlipidemia   . Hypertension   . Myocardial infarction [facility])    Assessment: Patient is a 53 yo male admitted for NSTEMI. Pharmacy consulted for Heparin dosing. Reviewed prior to admission medications, no anticoagulants listed.   Goal of Therapy:  Heparin level 0.3-0.7 units/ml Monitor platelets by anticoagulation protocol: Yes   Plan:  2/5 1723 HL 0.33 therapeutic. Continue heparin drip at 1000 units/hr. HL ordered for 2300 to confirm. CBC with morning labs.  2/5 PM heparin level 0.39. Continue current regimen. Recheck heparin level and CBC with tomorrow AM labs.  2/6 PM heparin level 0.49. Continue current regimen. Recheck heparin level and CBC with tomorrow AM labs  Fulton Reek, PharmD, BCPS  05/26/18 5:04 AM

## 2018-05-26 NOTE — Progress Notes (Signed)
Cardiovascular and Pulmonary Nurse Navigator Note:    Tyler Morales  is a 53 y.o. male with a known history of cerebral aneurysm, chronic kidney disease, coronary artery disease, diabetes without complication, hyperlipidemia, hypertension, myocardial infarction with stent in the past, who presented to the ED with c/o chest pain.  He is not very compliant with his medications.  Patient also experiencing more stress due to the recent loss of his father-in-law.   On the day of arrival to the ER he woke up from sleep with significant sharp chest pain in the center of his chest which was nonradiating.  He got concerned and took some brilianta, his pain subsided in 10 to 15 minutes.  Then presented to the ED.    Noted to have slightly elevated troponin.  There were no new EKG changes.   Patient underwent Cardiac Cath on 05/26/2018:   Laurier NancyKhan, Shaukat A, MD (Primary)  Caroleen Hammanunningham, Kristin, FNP  Procedures   LEFT HEART CATH AND CORONARY ANGIOGRAPHY  Conclusion     Mid RCA lesion is 30% stenosed.  Post Atrio lesion is 50% stenosed.  Dist LAD lesion is 50% stenosed.  Mid LAD lesion is 40% stenosed.  Prox Cx lesion is 40% stenosed.   Moderate disease in LAD and no significant restenosis in RCA with moderate disease distally and PDA and normal left ventricular systolic function.  Advise Brilinta as well as isosorbide 30 mg once a day.  Patient can be discharged with follow-up in the office Monday at 10 AM.   "Heart Attack Bouncing Back" booklet given and reviewed with patient. Discussed the definition of CAD. Reviewed the location of CAD.   ? Discussed modifiable risk factors including controlling blood pressure, cholesterol, and blood sugar; following heart healthy diet; maintaining healthy weight; exercise; and smoking cessation. Patient's HGB A1C was 13.5.   ? Discussed cardiac medications including rationale for taking, mechanisms of action, and side effects. Stressed the importance of taking  medications as prescribed. Patient informed this RN that he does not take his medications every day as prescribed.  Patient was on Brilinta prior to admission - although patient did not take medication on regular basis.  Explained to patient the need to take medications as prescribed in order to get the full benefits in controlling his heart disease and HTN.   ? Discussed emergency plan for heart attack symptoms. Patient verbalized understanding of need to call 911 and not to drive himself to ER if having cardiac symptoms / chest pain.  ? Heart healthy diet of low sodium, low fat, low cholesterol heart healthy diet discussed. Information on diet provided. Patient stated he has been slipping where his diet is concerned and has gained a few pounds and his HGB A1C is elevated.   ? Smoking Cessation - Patient is a current every day smoker. Patient reports smoking 1/2 pack per day.  Patient has been smoking since his early teeens. Patient has never tired to quit. "Thinking about Quitting - Yes You Can!" informational sheet reviewed with patient.   Drug Abuse - patient's urine toxicology was positive for cocaine and marijuana.  Discouraged drug abuse due to effects on the heart.    Exercise - Benefits of exercised discussed. Patient does not currently exercise.  Patient informed this RN he is very active on his job.   Informed patient that his cardiologist has referred him to outpatient Cardiac Rehab. An overview of the program was provided. Brochure, informational letter, class and orientation times, and CPT billing codes  given to patient. Patient is interested in participating. Patient plans to check with his insurance company to see what his out-of-pocket expenses will be. Patient has never participated in Cardiac Rehab in the past.  Patient plans to discuss with Dr. Welton Flakes when he sees him on Monday for his follow-up appointment.  Cardiac Rehab dept to contact patient within one to two weeks of discharge to  schedule his first appointment.    Patient appreciative of the information.  ? Army Melia, RN, BSN, [facility]  [place]  [facility] Cardiac & Pulmonary Rehab  Cardiovascular & Pulmonary Nurse Navigator  Direct Line: [phone]  Department Phone #: [phone] Fax: [phone]  Email Address: [email or web address]

## 2018-05-26 NOTE — Progress Notes (Addendum)
Inpatient Diabetes Program Recommendations  AACE/ADA: New Consensus Statement on Inpatient Glycemic Control (2015)  Target Ranges:  Prepandial:   less than 140 mg/dL      Peak postprandial:   less than 180 mg/dL (1-2 hours)      Critically ill patients:  140 - 180 mg/dL   Lab Results  Component Value Date   GLUCAP 332 (H) 05/26/2018   HGBA1C 13.5 (H) 05/25/2018    Review of Glycemic Control Results for MICHELLE, BOVA (MRN [id number]) as of 05/26/2018 09:16  Ref. Range 05/25/2018 13:05 05/25/2018 17:02 05/25/2018 21:34 05/26/2018 07:34  Glucose-Capillary Latest Ref Range: 70 - 99 mg/dL 381 (H) 829 (H) 937 (H) 332 (H)   Diabetes history: DM 2 Outpatient Diabetes medications:  Jardiance 25 mg daily, Amaryl 4 mg daily, Metformin 500 mg bid Current orders for Inpatient glycemic control:  Novolog sensitive tid with meals and HS  Inpatient Diabetes Program Recommendations:    Please add Lantus 16 units daily while in the hospital.  Will f/u with patient regarding A1C.  He likely needs insulin at home.   Thanks  Beryl Meager, RN, BC-ADM Inpatient Diabetes Coordinator Pager [phone] (8a-5p)

## 2018-05-26 NOTE — Progress Notes (Signed)
Went over discharge instructions with the patient including medications and follow-up appointment. Discontinue peripheral IV and telemetry monitor. RN help patient for transport.

## 2018-05-27 ENCOUNTER — Encounter: Payer: Self-pay | Admitting: Cardiovascular Disease

## 2018-05-27 LAB — HIV ANTIBODY (ROUTINE TESTING W REFLEX): HIV Screen 4th Generation wRfx: NONREACTIVE

## 2018-05-30 ENCOUNTER — Telehealth: Payer: Self-pay | Admitting: *Deleted

## 2018-06-01 NOTE — Discharge Summary (Signed)
[employer] - [place] [facility]   PATIENT NAME: Tyler Morales    MR#:  [id number]  DATE OF BIRTH:  Feb 19, 1966  DATE OF ADMISSION:  05/25/2018 ADMITTING PHYSICIAN: Altamese Dilling, MD  DATE OF DISCHARGE: 05/26/2018  4:59 PM  PRIMARY CARE PHYSICIAN: Margaretann Loveless, MD   ADMISSION DIAGNOSIS:  NSTEMI (non-ST elevated myocardial infarction) (HCC) [I21.4] Chest pain, unspecified type [R07.9]  DISCHARGE DIAGNOSIS:  Active Problems:   NSTEMI (non-ST elevated myocardial infarction) (HCC)   SECONDARY DIAGNOSIS:   Past Medical History:  Diagnosis Date  . Cerebral aneurysm without rupture   . Chronic kidney disease   . Coronary artery disease   . Diabetes mellitus without complication (HCC)   . Hyperlipidemia   . Hypertension   . Myocardial infarction [facility])      ADMITTING HISTORY  HISTORY OF PRESENT ILLNESS: Tyler Morales  is a 53 y.o. male with a known history of cerebral aneurysm, chronic kidney disease, coronary artery disease, diabetes without complication, hyperlipidemia, hypertension, myocardial infarction with stent in the past.  He is not very compliant with his medications.  His father-in-law died last weekend and funeral is tomorrow.  He is going through stress because of this as per wife.  Today morning he woke up from sleep with significant sharp chest pain in the center of his chest which was nonradiating.  He got concerned and took some brilianta, his pain subsides in 10 to 15 minutes.  Concerned with this he came to emergency room.  Noted to have slightly elevated troponin.  There were no new EKG changes.  ER physician spoke to his primary cardiologist Dr. Adrian Blackwater, he suggested to start him on heparin IV drip for non-ST elevation MI and he will do cardiac catheterization tomorrow.  HOSPITAL COURSE:   *Non-ST elevation MI.  Patient was admitted with aspirin, heparin drip, beta-blockers.  No chest pain in the hospital.  Patient was seen by  cardiology Dr. Welton Flakes and taken to Cath Lab.  Patient had similar CAD as prior catheterization.  No PCI needed.  Acute issue secondary to cocaine use.  Brilinta added.  Imdur added.  Cardiac rehab referral.  Patient discharged home with dual antiplatelet therapy, beta-blockers, statin, nitrates.  Ambulated without any problems prior to discharge  Patient's hypertension and diabetes remained stable.  Counseled to quit smoking  Counseled to quit cocaine  Discharged home in stable condition to follow-up with cardiology on Monday.  CONSULTS OBTAINED:  Treatment Team:  Laurier Nancy, MD  DRUG ALLERGIES:  No Known Allergies  DISCHARGE MEDICATIONS:   Allergies as of 05/26/2018   No Known Allergies     Medication List    TAKE these medications   amLODipine 10 MG tablet Commonly known as:  NORVASC Take 10 mg by mouth daily.   aspirin 81 MG EC tablet Take 1 tablet (81 mg total) by mouth daily.   atorvastatin 80 MG tablet Commonly known as:  LIPITOR Take 1 tablet (80 mg total) by mouth daily at 6 PM.   carvedilol 12.5 MG tablet Commonly known as:  COREG Take 12.5 mg by mouth 2 (two) times daily.   glimepiride 4 MG tablet Commonly known as:  AMARYL Take 4 mg by mouth daily with breakfast.   isosorbide mononitrate 30 MG 24 hr tablet Commonly known as:  IMDUR Take 1 tablet (30 mg total) by mouth daily.   JARDIANCE 25 MG Tabs tablet Generic drug:  empagliflozin Take 25 mg by mouth daily.  metFORMIN 500 MG tablet Commonly known as:  GLUCOPHAGE Take 1 tablet (500 mg total) by mouth 2 (two) times daily with a meal.   ticagrelor 90 MG Tabs tablet Commonly known as:  BRILINTA Take 1 tablet (90 mg total) by mouth 2 (two) times daily. What changed:  Another medication with the same name was added. Make sure you understand how and when to take each.   ticagrelor 90 MG Tabs tablet Commonly known as:  BRILINTA Take 1 tablet (90 mg total) by mouth 2 (two) times daily. What  changed:  You were already taking a medication with the same name, and this prescription was added. Make sure you understand how and when to take each.       Today   VITAL SIGNS:  Blood pressure 116/81, pulse 78, temperature 98.4 F (36.9 C), temperature source Oral, resp. rate 18, height 5\' 5"  (1.651 m), weight 81.6 kg, SpO2 98 %.  I/O:  No intake or output data in the 24 hours ending 06/01/18 1038  PHYSICAL EXAMINATION:  Physical Exam  GENERAL:  53 y.o.-year-old patient lying in the bed with no acute distress.  LUNGS: Normal breath sounds bilaterally, no wheezing, rales,rhonchi or crepitation. No use of accessory muscles of respiration.  CARDIOVASCULAR: S1, S2 normal. No murmurs, rubs, or gallops.  ABDOMEN: Soft, non-tender, non-distended. Bowel sounds present. No organomegaly or mass.  NEUROLOGIC: Moves all 4 extremities. PSYCHIATRIC: The patient is alert and oriented x 3.  SKIN: No obvious rash, lesion, or ulcer.   DATA REVIEW:   CBC Recent Labs  Lab 05/26/18 0317  WBC 8.7  HGB 15.1  HCT 44.1  PLT 324    Chemistries  Recent Labs  Lab 05/26/18 0317  NA 132*  K 3.7  CL 99  CO2 23  GLUCOSE 350*  BUN 21*  CREATININE 1.03  CALCIUM 9.2    Cardiac Enzymes Recent Labs  Lab 05/25/18 2259  TROPONINI 3.67*    Microbiology Results  No results found for this or any previous visit.  RADIOLOGY:  No results found.  Follow up with PCP in 1 week.  Management plans discussed with the patient, family and they are in agreement.  CODE STATUS:  Code Status History    Date Active Date Inactive Code Status Order ID Comments User Context   05/25/2018 1623 05/26/2018 1959 Full Code [id number]  Altamese Dilling, MD Inpatient   09/24/2015 0611 09/25/2015 1535 Full Code [id number]  Arnaldo Natal, MD ED      TOTAL TIME TAKING CARE OF THIS PATIENT ON DAY OF DISCHARGE: more than 30 minutes.   Orie Fisherman M.D on 06/01/2018 at 10:38 AM  Between 7am to 6pm - Pager  - [phone]  After 6pm go to [email or web address] - password [email or web address] [facility]  [facility] [facility]  Office  [phone]  CC: Primary care physician; Margaretann Loveless, MD  Note: This dictation was prepared with Dragon dictation along with smaller phrase technology. Any transcriptional errors that result from this process are unintentional.

## 2018-12-09 ENCOUNTER — Other Ambulatory Visit: Payer: Self-pay

## 2018-12-09 DIAGNOSIS — Z20822 Contact with and (suspected) exposure to covid-19: Secondary | ICD-10-CM

## 2018-12-10 LAB — NOVEL CORONAVIRUS, NAA: SARS-CoV-2, NAA: NOT DETECTED

## 2018-12-15 ENCOUNTER — Other Ambulatory Visit: Payer: Self-pay | Admitting: *Deleted

## 2018-12-15 DIAGNOSIS — Z20822 Contact with and (suspected) exposure to covid-19: Secondary | ICD-10-CM

## 2018-12-16 LAB — NOVEL CORONAVIRUS, NAA: SARS-CoV-2, NAA: NOT DETECTED

## 2020-09-11 ENCOUNTER — Observation Stay
Admission: EM | Admit: 2020-09-11 | Discharge: 2020-09-12 | Disposition: A | Discharge status: Home / Self Care | Payer: BC Managed Care – PPO | Attending: Internal Medicine | Admitting: Internal Medicine

## 2020-09-11 ENCOUNTER — Emergency Department: Payer: BC Managed Care – PPO

## 2020-09-11 ENCOUNTER — Other Ambulatory Visit: Payer: Self-pay

## 2020-09-11 DIAGNOSIS — L03211 Cellulitis of face: Secondary | ICD-10-CM | POA: Diagnosis not present

## 2020-09-11 DIAGNOSIS — E118 Type 2 diabetes mellitus with unspecified complications: Secondary | ICD-10-CM

## 2020-09-11 DIAGNOSIS — R220 Localized swelling, mass and lump, head: Secondary | ICD-10-CM | POA: Diagnosis present

## 2020-09-11 DIAGNOSIS — E1169 Type 2 diabetes mellitus with other specified complication: Secondary | ICD-10-CM

## 2020-09-11 DIAGNOSIS — Z79899 Other long term (current) drug therapy: Secondary | ICD-10-CM | POA: Insufficient documentation

## 2020-09-11 DIAGNOSIS — I10 Essential (primary) hypertension: Secondary | ICD-10-CM

## 2020-09-11 DIAGNOSIS — I129 Hypertensive chronic kidney disease with stage 1 through stage 4 chronic kidney disease, or unspecified chronic kidney disease: Secondary | ICD-10-CM | POA: Insufficient documentation

## 2020-09-11 DIAGNOSIS — E119 Type 2 diabetes mellitus without complications: Secondary | ICD-10-CM | POA: Diagnosis not present

## 2020-09-11 DIAGNOSIS — F17200 Nicotine dependence, unspecified, uncomplicated: Secondary | ICD-10-CM | POA: Diagnosis present

## 2020-09-11 DIAGNOSIS — I2510 Atherosclerotic heart disease of native coronary artery without angina pectoris: Secondary | ICD-10-CM | POA: Insufficient documentation

## 2020-09-11 DIAGNOSIS — Z7984 Long term (current) use of oral hypoglycemic drugs: Secondary | ICD-10-CM | POA: Diagnosis not present

## 2020-09-11 DIAGNOSIS — N189 Chronic kidney disease, unspecified: Secondary | ICD-10-CM | POA: Diagnosis not present

## 2020-09-11 DIAGNOSIS — F17210 Nicotine dependence, cigarettes, uncomplicated: Secondary | ICD-10-CM | POA: Diagnosis not present

## 2020-09-11 DIAGNOSIS — Z7982 Long term (current) use of aspirin: Secondary | ICD-10-CM | POA: Diagnosis not present

## 2020-09-11 DIAGNOSIS — L03213 Periorbital cellulitis: Secondary | ICD-10-CM

## 2020-09-11 DIAGNOSIS — Z20822 Contact with and (suspected) exposure to covid-19: Secondary | ICD-10-CM | POA: Diagnosis not present

## 2020-09-11 LAB — CBC WITH DIFFERENTIAL/PLATELET
Abs Immature Granulocytes: 0.04 10*3/uL (ref 0.00–0.07)
Basophils Absolute: 0.1 10*3/uL (ref 0.0–0.1)
Basophils Relative: 1 %
Eosinophils Absolute: 0.3 10*3/uL (ref 0.0–0.5)
Eosinophils Relative: 2 %
HCT: 44.6 % (ref 39.0–52.0)
Hemoglobin: 15.2 g/dL (ref 13.0–17.0)
Immature Granulocytes: 0 %
Lymphocytes Relative: 34 %
Lymphs Abs: 3.9 10*3/uL (ref 0.7–4.0)
MCH: 31.2 pg (ref 26.0–34.0)
MCHC: 34.1 g/dL (ref 30.0–36.0)
MCV: 91.6 fL (ref 80.0–100.0)
Monocytes Absolute: 0.8 10*3/uL (ref 0.1–1.0)
Monocytes Relative: 7 %
Neutro Abs: 6.3 10*3/uL (ref 1.7–7.7)
Neutrophils Relative %: 56 %
Platelets: 312 10*3/uL (ref 150–400)
RBC: 4.87 MIL/uL (ref 4.22–5.81)
RDW: 14.1 % (ref 11.5–15.5)
WBC: 11.4 10*3/uL — ABNORMAL HIGH (ref 4.0–10.5)
nRBC: 0 % (ref 0.0–0.2)

## 2020-09-11 LAB — GLUCOSE, CAPILLARY
Glucose-Capillary: 114 mg/dL — ABNORMAL HIGH (ref 70–99)
Glucose-Capillary: 117 mg/dL — ABNORMAL HIGH (ref 70–99)
Glucose-Capillary: 114 mg/dL — ABNORMAL HIGH (ref 70–99)
Glucose-Capillary: 116 mg/dL — ABNORMAL HIGH (ref 70–99)

## 2020-09-11 LAB — RESP PANEL BY RT-PCR (FLU A&B, COVID) ARPGX2
Influenza A by PCR: NEGATIVE
Influenza B by PCR: NEGATIVE
SARS Coronavirus 2 by RT PCR: NEGATIVE

## 2020-09-11 LAB — HIV ANTIBODY (ROUTINE TESTING W REFLEX): HIV Screen 4th Generation wRfx: NONREACTIVE

## 2020-09-11 LAB — CULTURE, BLOOD (ROUTINE X 2)

## 2020-09-11 LAB — BASIC METABOLIC PANEL
Anion gap: 8 (ref 5–15)
BUN: 14 mg/dL (ref 6–20)
CO2: 24 mmol/L (ref 22–32)
Calcium: 9.3 mg/dL (ref 8.9–10.3)
Chloride: 106 mmol/L (ref 98–111)
Creatinine, Ser: 0.97 mg/dL (ref 0.61–1.24)
GFR, Estimated: >60 mL/min (ref >60)
Glucose, Bld: 112 mg/dL — ABNORMAL HIGH (ref 70–99)
Potassium: 3.6 mmol/L (ref 3.5–5.1)
Sodium: 138 mmol/L (ref 135–145)

## 2020-09-11 LAB — HEMOGLOBIN A1C
Hgb A1c MFr Bld: 6.7 % — ABNORMAL HIGH (ref 4.8–5.6)
Mean Plasma Glucose: 146 mg/dL

## 2020-09-11 LAB — LACTIC ACID, PLASMA: Lactic Acid, Venous: 1.2 mmol/L (ref 0.5–1.9)

## 2020-09-11 MED ORDER — NICOTINE 21 MG/24HR TD PT24
21.0000 mg | MEDICATED_PATCH | Freq: Once | TRANSDERMAL | Status: AC
  Administered 2020-09-11: 21 mg via TRANSDERMAL
  Filled 2020-09-11: qty 1

## 2020-09-11 MED ORDER — VANCOMYCIN HCL 500 MG/100ML IV SOLN
500.0000 mg | Freq: Once | INTRAVENOUS | Status: AC
  Administered 2020-09-11: 500 mg via INTRAVENOUS
  Filled 2020-09-11: qty 100

## 2020-09-11 MED ORDER — VITAMIN D (ERGOCALCIFEROL) 1.25 MG (50000 UNIT) PO CAPS: 50000.0000 [IU] | ORAL_CAPSULE | ORAL | Status: DC

## 2020-09-11 MED ORDER — TETRACAINE HCL 0.5 % OP SOLN
2.0000 [drp] | Freq: Once | OPHTHALMIC | Status: AC
  Administered 2020-09-11: 2 [drp] via OPHTHALMIC
  Filled 2020-09-11: qty 4

## 2020-09-11 MED ORDER — ACETAMINOPHEN 325 MG PO TABS
650.0000 mg | ORAL_TABLET | Freq: Four times a day (QID) | ORAL | Status: DC | PRN
  Administered 2020-09-11: 650 mg via ORAL
  Filled 2020-09-11: qty 2

## 2020-09-11 MED ORDER — ATORVASTATIN CALCIUM 20 MG PO TABS
80.0000 mg | ORAL_TABLET | Freq: Every day | ORAL | Status: DC
  Administered 2020-09-11: 80 mg via ORAL
  Filled 2020-09-11: qty 4

## 2020-09-11 MED ORDER — SODIUM CHLORIDE 0.9% FLUSH
3.0000 mL | Freq: Two times a day (BID) | INTRAVENOUS | Status: DC
  Administered 2020-09-11 – 2020-09-12 (×3): 3 mL via INTRAVENOUS

## 2020-09-11 MED ORDER — ACETAMINOPHEN 650 MG RE SUPP: 650.0000 mg | Freq: Four times a day (QID) | RECTAL | Status: DC | PRN

## 2020-09-11 MED ORDER — VANCOMYCIN HCL IN DEXTROSE 1-5 GM/200ML-% IV SOLN
1000.0000 mg | Freq: Once | INTRAVENOUS | Status: AC
  Administered 2020-09-11: 1000 mg via INTRAVENOUS
  Filled 2020-09-11: qty 200

## 2020-09-11 MED ORDER — VANCOMYCIN HCL 1000 MG/200ML IV SOLN
1000.0000 mg | Freq: Two times a day (BID) | INTRAVENOUS | Status: DC
  Administered 2020-09-11 – 2020-09-12 (×2): 1000 mg via INTRAVENOUS
  Filled 2020-09-11 (×3): qty 200

## 2020-09-11 MED ORDER — INSULIN ASPART 100 UNIT/ML IJ SOLN: 0.0000 [IU] | Freq: Three times a day (TID) | INTRAMUSCULAR | Status: DC

## 2020-09-11 MED ORDER — ONDANSETRON HCL 4 MG PO TABS: 4.0000 mg | ORAL_TABLET | Freq: Four times a day (QID) | ORAL | Status: DC | PRN

## 2020-09-11 MED ORDER — ASPIRIN EC 81 MG PO TBEC
81.0000 mg | DELAYED_RELEASE_TABLET | Freq: Every day | ORAL | Status: DC
  Administered 2020-09-11 – 2020-09-12 (×2): 81 mg via ORAL
  Filled 2020-09-11 (×2): qty 1

## 2020-09-11 MED ORDER — ISOSORBIDE MONONITRATE ER 30 MG PO TB24
30.0000 mg | ORAL_TABLET | Freq: Every day | ORAL | Status: DC
  Administered 2020-09-12: 30 mg via ORAL
  Filled 2020-09-11 (×3): qty 1

## 2020-09-11 MED ORDER — CARVEDILOL 25 MG PO TABS
25.0000 mg | ORAL_TABLET | Freq: Two times a day (BID) | ORAL | Status: DC
  Administered 2020-09-11 – 2020-09-12 (×4): 25 mg via ORAL
  Filled 2020-09-11 (×3): qty 1

## 2020-09-11 MED ORDER — FLUORESCEIN SODIUM 1 MG OP STRP
1.0000 | ORAL_STRIP | Freq: Once | OPHTHALMIC | Status: AC
  Administered 2020-09-11: 1 via OPHTHALMIC
  Filled 2020-09-11: qty 1

## 2020-09-11 MED ORDER — AMLODIPINE BESYLATE 10 MG PO TABS
10.0000 mg | ORAL_TABLET | Freq: Every day | ORAL | Status: DC
  Administered 2020-09-12: 10 mg via ORAL
  Filled 2020-09-11 (×3): qty 1

## 2020-09-11 MED ORDER — NICOTINE 21 MG/24HR TD PT24: 21.0000 mg | MEDICATED_PATCH | Freq: Once | TRANSDERMAL | Status: DC

## 2020-09-11 MED ORDER — CLINDAMYCIN PHOSPHATE 600 MG/50ML IV SOLN: 600.0000 mg | Freq: Three times a day (TID) | INTRAVENOUS | Status: DC

## 2020-09-11 MED ORDER — ONDANSETRON HCL 4 MG/2ML IJ SOLN: 4.0000 mg | Freq: Four times a day (QID) | INTRAMUSCULAR | Status: DC | PRN

## 2020-09-11 MED ORDER — ENALAPRIL MALEATE 10 MG PO TABS
10.0000 mg | ORAL_TABLET | Freq: Every day | ORAL | Status: DC
  Administered 2020-09-11 – 2020-09-12 (×2): 10 mg via ORAL
  Filled 2020-09-11 (×2): qty 1

## 2020-09-11 MED ORDER — SODIUM CHLORIDE 0.9 % IV SOLN
1.0000 g | INTRAVENOUS | Status: DC
  Administered 2020-09-12: 1 g via INTRAVENOUS
  Filled 2020-09-11: qty 1

## 2020-09-11 MED ORDER — SODIUM CHLORIDE 0.9% FLUSH: 3.0000 mL | INTRAVENOUS | Status: DC | PRN

## 2020-09-11 MED ORDER — TICAGRELOR 60 MG PO TABS
60.0000 mg | ORAL_TABLET | Freq: Two times a day (BID) | ORAL | Status: DC
  Administered 2020-09-11 – 2020-09-12 (×4): 60 mg via ORAL
  Filled 2020-09-11 (×5): qty 1

## 2020-09-11 MED ORDER — SODIUM CHLORIDE 0.9 % IV SOLN: 250.0000 mL | INTRAVENOUS | Status: DC | PRN

## 2020-09-11 MED ORDER — SODIUM CHLORIDE 0.9 % IV SOLN
1.0000 g | Freq: Once | INTRAVENOUS | Status: AC
  Administered 2020-09-11: 1 g via INTRAVENOUS
  Filled 2020-09-11: qty 10

## 2020-09-11 NOTE — ED Notes (Signed)
Patient transported to CT 

## 2020-09-11 NOTE — Plan of Care (Signed)
  Problem: Health Behavior/Discharge Planning: Goal: Ability to manage health-related needs will improve Outcome: Progressing   Problem: Clinical Measurements: Goal: Ability to maintain clinical measurements within normal limits will improve Outcome: Progressing Goal: Will remain free from infection Outcome: Progressing Goal: Diagnostic test results will improve Outcome: Progressing Goal: Respiratory complications will improve Outcome: Progressing Goal: Cardiovascular complication will be avoided Outcome: Progressing   Problem: Pain Managment: Goal: General experience of comfort will improve Outcome: Progressing   

## 2020-09-11 NOTE — Progress Notes (Addendum)
Patient admitted from ED at [phone] with Left eye and Left facial swelling.  Patient A+Ox4. Room Air.  Independent.  Steady gait. Patient is hypertensive.  Blood pressure medications administered (carvedilol and enalapril).  Patient refused Norvasc and Imdur.  PIV to Left ForArm patent and ready for IV Antibiotic Therapy.  MD aware of patient's arrival.  Call bell and needs within reach.  Will continue to monitor and assess with plan of care.

## 2020-09-11 NOTE — ED Notes (Signed)
Pt ambulatory to restroom

## 2020-09-11 NOTE — ED Provider Notes (Signed)
[facility] Emergency Department Provider Note  ____________________________________________   Event Date/Time   First MD Initiated Contact with Patient 09/11/20 [phone]     (approximate)  I have reviewed the triage vital signs and the nursing notes.   HISTORY  Chief Complaint eye swelling    HPI Tyler Morales is a 55 y.o. male with history of hypertension, diabetes, hyperlipidemia, CAD who presents to the emergency department with complaints of swelling and pain around the left eye, eyelid for the past day.  States it is very painful to touch and he has had purulent drainage from the eye.  Denies any eye pain, vision changes, vision loss.  No injury to the face or eye.  Does not think he is got anything into his eye.  Does not wear contacts or glasses.  No recent eye surgeries.  No fevers.  Has never had any symptoms like this before.  Denies any new exposures.  No other swelling, rash.        Past Medical History:  Diagnosis Date  . Cerebral aneurysm without rupture   . Chronic kidney disease   . Coronary artery disease   . Diabetes mellitus without complication (HCC)   . Hyperlipidemia   . Hypertension   . Myocardial infarction [facility])     Patient Active Problem List   Diagnosis Date Noted  . Facial cellulitis 09/11/2020  . NSTEMI (non-ST elevated myocardial infarction) (HCC) 09/24/2015    Past Surgical History:  Procedure Laterality Date  . CARDIAC CATHETERIZATION Right 09/24/2015   Procedure: Left Heart Cath and Coronary Angiography;  Surgeon: Laurier Nancy, MD;  Location: [facility] INVASIVE CV LAB;  Service: Cardiovascular;  Laterality: Right;  . CARDIAC CATHETERIZATION N/A 09/24/2015   Procedure: Coronary Stent Intervention;  Surgeon: Alwyn Pea, MD;  Location: [facility] INVASIVE CV LAB;  Service: Cardiovascular;  Laterality: N/A;  . CORONARY ANGIOPLASTY     stent placement 6/17  . LEFT HEART CATH AND CORONARY ANGIOGRAPHY Left 05/26/2018    Procedure: LEFT HEART CATH AND CORONARY ANGIOGRAPHY;  Surgeon: Laurier Nancy, MD;  Location: [facility] INVASIVE CV LAB;  Service: Cardiovascular;  Laterality: Left;  . SHOULDER SURGERY Left     Prior to Admission medications   Medication Sig Start Date End Date Taking? Authorizing Provider  amLODipine (NORVASC) 10 MG tablet Take 10 mg by mouth daily.    [provider]  aspirin EC 81 MG EC tablet Take 1 tablet (81 mg total) by mouth daily. 09/25/15   Hower, Cletis Athens, MD  atorvastatin (LIPITOR) 80 MG tablet Take 1 tablet (80 mg total) by mouth daily at 6 PM. 09/25/15   Hower, Cletis Athens, MD  carvedilol (COREG) 12.5 MG tablet Take 12.5 mg by mouth 2 (two) times daily. 07/12/16   [provider]  empagliflozin (JARDIANCE) 25 MG TABS tablet Take 25 mg by mouth daily.    [provider]  glimepiride (AMARYL) 4 MG tablet Take 4 mg by mouth daily with breakfast.    [provider]  isosorbide mononitrate (IMDUR) 30 MG 24 hr tablet Take 1 tablet (30 mg total) by mouth daily. 05/26/18   Milagros Loll, MD  metFORMIN (GLUCOPHAGE) 500 MG tablet Take 1 tablet (500 mg total) by mouth 2 (two) times daily with a meal. 09/25/15   Hower, Cletis Athens, MD  ticagrelor (BRILINTA) 90 MG TABS tablet Take 1 tablet (90 mg total) by mouth 2 (two) times daily. 09/25/15   Hower, Cletis Athens, MD  ticagrelor (BRILINTA) 90 MG TABS tablet Take 1 tablet (90 mg total) by mouth 2 (two) times daily. 05/26/18   Milagros Loll, MD    Allergies Patient has no known allergies.  Family History  Problem Relation Age of Onset  . Diabetes Mellitus II Other     Social History Social History   Tobacco Use  . Smoking status: Current Every Day Smoker    Packs/day: 0.50    Types: Cigarettes  . Smokeless tobacco: Never Used  Vaping Use  . Vaping Use: Never used  Substance Use Topics  . Alcohol use: Yes    Alcohol/week: 1.0 standard drink    Types: 1 Cans of beer per week    Comment: weekly,none last 24hrs  . Drug  use: Yes    Types: Marijuana    Comment: history of illicit drug use including cocaine, THC, benzos, and opiates    Review of Systems Constitutional: No fever. Eyes: No visual changes. ENT: No sore throat. Cardiovascular: Denies chest pain. Respiratory: Denies shortness of breath. Gastrointestinal: No nausea, vomiting, diarrhea. Genitourinary: Negative for dysuria. Musculoskeletal: Negative for back pain. Skin: Negative for rash. Neurological: Negative for focal weakness or numbness.  ____________________________________________   PHYSICAL EXAM:  VITAL SIGNS: ED Triage Vitals  Enc Vitals Group     BP 09/11/20 0243 (!) 157/98     Pulse Rate 09/11/20 0243 88     Resp 09/11/20 0243 16     Temp 09/11/20 0243 98.8 F (37.1 C)     Temp Source 09/11/20 0243 Oral     SpO2 09/11/20 0243 98 %     Weight 09/11/20 0251 168 lb (76.2 kg)     Height 09/11/20 0251 5\' 6"  (1.676 m)     Head Circumference --      Peak Flow --      Pain Score 09/11/20 0251 0     Pain Loc --      Pain Edu? --      Excl. in GC? --    CONSTITUTIONAL: Alert and oriented and responds appropriately to questions. Well-appearing; well-nourished HEAD: Normocephalic EYES: Conjunctivae clear, pupils appear equal, EOM appear intact, no pain with movement of the left eye, intraocular pressure of the left eye is 15 mmHg, no fluorescein uptake, no hyphema or hypopyon, some purulent drainage noted to the lateral aspect of the eye.  He does have significant pain around the left eye with swelling of the upper eyelid.  Area is slightly warm to touch and discolored. Bilateral Distance: 20/40 R Distance: 20/40 L Distance: 20/25 ENT: normal nose; moist mucous membranes NECK: Supple, normal ROM CARD: RRR; S1 and S2 appreciated; no murmurs, no clicks, no rubs, no gallops RESP: Normal chest excursion without splinting or tachypnea; breath sounds clear and equal bilaterally; no wheezes, no rhonchi, no rales, no hypoxia or  respiratory distress, speaking full sentences ABD/GI: Normal bowel sounds; non-distended; soft, non-tender, no rebound, no guarding, no peritoneal signs, no hepatosplenomegaly BACK: The back appears normal EXT: Normal ROM in all joints; no deformity noted, no edema; no cyanosis SKIN: Normal color for age and race; warm; no rash on exposed skin NEURO: Moves all extremities equally PSYCH: The patient's mood and manner are appropriate.    Patient gave verbal permission to utilize photo for medical documentation only. The image was not stored on any personal device.   ____________________________________________   LABS (all labs ordered are listed, but only abnormal results are displayed)  Labs Reviewed  CBC WITH DIFFERENTIAL/PLATELET -  Abnormal; Notable for the following components:      Result Value   WBC 11.4 (*)    All other components within normal limits  BASIC METABOLIC PANEL - Abnormal; Notable for the following components:   Glucose, Bld 112 (*)    All other components within normal limits  RESP PANEL BY RT-PCR (FLU A&B, COVID) ARPGX2  CULTURE, BLOOD (ROUTINE X 2)  CULTURE, BLOOD (ROUTINE X 2)  LACTIC ACID, PLASMA   ____________________________________________  EKG   ____________________________________________  RADIOLOGY I, Delainee Tramel, personally viewed and evaluated these images (plain radiographs) as part of my medical decision making, as well as reviewing the written report by the radiologist.  ED MD interpretation: Preseptal cellulitis.  Official radiology report(s): CT Maxillofacial Wo Contrast  Result Date: 09/11/2020 CLINICAL DATA:  Cellulitis, face EXAM: CT MAXILLOFACIAL WITHOUT CONTRAST TECHNIQUE: Multidetector CT imaging of the maxillofacial structures was performed. Multiplanar CT image reconstructions were also generated. COMPARISON:  CT head 04/03/2009 FINDINGS: Osseous: No cortical erosion or destruction. No fracture or mandibular dislocation. No  destructive process. Patient is edentulous other than a single eroded right maxillary tooth with periapical lucency. Orbits: Negative. No traumatic or inflammatory finding. Sinuses: Paranasal sinuses are clear. There is persistent trace effusion within the left mastoid air cells. Soft tissues: Left periorbital subcutaneus soft tissue edema and dermal thickening extending inferiorly to the left maxillary soft tissues. No organized fluid collection. No emphysema. Limited intracranial: No significant or unexpected finding. IMPRESSION: Left face findings consistent with periorbital cellulitis extending to the left maxillary soft tissues. No organized fluid collection. Electronically Signed   By: Tish FredericksonMorgane  Naveau M.D.   On: 09/11/2020 06:05    ____________________________________________   PROCEDURES  Procedure(s) performed (including Critical Care):  Procedures  ____________________________________________   INITIAL IMPRESSION / ASSESSMENT AND PLAN / ED COURSE  As part of my medical decision making, I reviewed the following data within the electronic MEDICAL RECORD NUMBER Nursing notes reviewed and incorporated, Labs reviewed , Old chart reviewed, Radiograph reviewed , Discussed with admitting physician  and Notes from prior ED visits         Patient here with facial cellulitis.  Will obtain CT scan to evaluate for possible post septal cellulitis.  Discussed with radiologist who recommends continuing with no IV contrast at this time given national contrast shortage.  Will obtain labs, cultures.  He declines pain medicine currently.  ED PROGRESS  Labs show mild leukocytosis.  Normal lactic.  CT scan shows findings consistent with periorbital cellulitis that extends into the left maxillary soft tissues without organized fluid collection or post septal cellulitis.  Will discuss with hospitalist.  Will give vancomycin, Rocephin for broad coverage.   6:45 AM  Discussed patient's case with hospitalist,  Dr. Arville CareMansy.  I have recommended admission and patient (and family if present) agree with this plan. Admitting physician will place admission orders.   I reviewed all nursing notes, vitals, pertinent previous records and reviewed/interpreted all EKGs, lab and urine results, imaging (as available).   ____________________________________________   FINAL CLINICAL IMPRESSION(S) / ED DIAGNOSES  Final diagnoses:  Preseptal cellulitis of right eye     ED Discharge Orders    None      *Please note:  Valera CastleKeith D Kirn was evaluated in Emergency Department on 09/11/2020 for the symptoms described in the history of present illness. He was evaluated in the context of the global COVID-19 pandemic, which necessitated consideration that the patient might be at risk for infection with the SARS-CoV-2  virus that causes COVID-19. Institutional protocols and algorithms that pertain to the evaluation of patients at risk for COVID-19 are in a state of rapid change based on information released by regulatory bodies including the CDC and federal and state organizations. These policies and algorithms were followed during the patient's care in the ED.  Some ED evaluations and interventions may be delayed as a result of limited staffing during and the pandemic.*   Note:  This document was prepared using Dragon voice recognition software and may include unintentional dictation errors.   Keyasha Miah, Layla Maw, DO 09/11/20 [phone]

## 2020-09-11 NOTE — H&P (Signed)
History and Physical    Tyler Morales WFU:[id number] DOB: Aug 25, 1965 DOA: 09/11/2020  PCP: Margaretann Loveless, MD   Patient coming from: Home  I have personally briefly reviewed patient's old medical records in [facility]  Chief Complaint: Left facial swelling  HPI: Tyler Morales is a 55 y.o. male with medical history significant for hypertension, diabetes mellitus, coronary artery disease, nicotine dependence who presents to the ER for evaluation of swelling which initially started around his left associated with redness and then over the last 24 hours extension to involve the left side of his face. Patient states that it is painful to touch and has had some purulent drainage from his left. He denies having any pain in his left leg, no vision changes, no vision loss, no trauma to his face and does not wear contact lenses.  He has not had any fever or chills.   He denies having any chest pain, no abdominal pain, no dizziness, no lightheadedness, no headache, no cough, no urinary symptoms, no changes in his bowel habits, no weakness, no lightheadedness, no palpitations, no diaphoresis, no nausea, no vomiting. He presented to the ER for evaluation due to worsening of his symptoms. Labs show sodium 138, potassium 3.6, chloride 106, bicarb 24, glucose 112, BUN 14, creatinine 0.97, calcium 9.3, lactic acid 1.2, white count 11.4, hemoglobin 15.2, hematocrit 44.6, MCV 91.6, RDW 14.1, platelet count 312 Respiratory viral panel is negative CT maxillofacial without contrast shows left face findings consistent with periorbital cellulitis extending to the left maxillary soft tissues. No organized fluid collection.      ED Course: Patient is a 55 year old male who presents to the ER for evaluation of swelling and pain involving the left side of his face.  Initially started as periorbital cellulitis with progression to the left side of his face.  He received IV vancomycin and Rocephin in the ER  and will be admitted to the hospital for further evaluation.  Review of Systems: As per HPI otherwise all other systems reviewed and negative.    Past Medical History:  Diagnosis Date  . Cerebral aneurysm without rupture   . Chronic kidney disease   . Coronary artery disease   . Diabetes mellitus without complication (HCC)   . Hyperlipidemia   . Hypertension   . Myocardial infarction [facility])     Past Surgical History:  Procedure Laterality Date  . CARDIAC CATHETERIZATION Right 09/24/2015   Procedure: Left Heart Cath and Coronary Angiography;  Surgeon: Laurier Nancy, MD;  Location: [facility] INVASIVE CV LAB;  Service: Cardiovascular;  Laterality: Right;  . CARDIAC CATHETERIZATION N/A 09/24/2015   Procedure: Coronary Stent Intervention;  Surgeon: Alwyn Pea, MD;  Location: [facility] INVASIVE CV LAB;  Service: Cardiovascular;  Laterality: N/A;  . CORONARY ANGIOPLASTY     stent placement 6/17  . LEFT HEART CATH AND CORONARY ANGIOGRAPHY Left 05/26/2018   Procedure: LEFT HEART CATH AND CORONARY ANGIOGRAPHY;  Surgeon: Laurier Nancy, MD;  Location: [facility] INVASIVE CV LAB;  Service: Cardiovascular;  Laterality: Left;  . SHOULDER SURGERY Left      reports that he has been smoking cigarettes. He has been smoking about 0.50 packs per day. He has never used smokeless tobacco. He reports current alcohol use of about 1.0 standard drink of alcohol per week. He reports current drug use. Drug: Marijuana.  No Known Allergies  Family History  Problem Relation Age of Onset  . Diabetes Mellitus II Other  Prior to Admission medications   Medication Sig Start Date End Date Taking? Authorizing Provider  aspirin EC 81 MG EC tablet Take 1 tablet (81 mg total) by mouth daily. 09/25/15  Yes Hower, Cletis Athensavid K, MD  atorvastatin (LIPITOR) 80 MG tablet Take 1 tablet (80 mg total) by mouth daily at 6 PM. 09/25/15  Yes Hower, Cletis Athensavid K, MD  BRILINTA 60 MG TABS tablet Take 60 mg by mouth 2 (two) times daily. 08/06/20  Yes  [provider]  carvedilol (COREG) 25 MG tablet Take 25 mg by mouth 2 (two) times daily. 06/17/20  Yes [provider]  enalapril (VASOTEC) 10 MG tablet Take 10 mg by mouth daily. 09/10/20  Yes [provider]  FARXIGA 10 MG TABS tablet Take 10 mg by mouth daily. 09/04/20  Yes [provider]  RYBELSUS 7 MG TABS Take 1 tablet by mouth every morning. 06/13/20  Yes [provider]  Vitamin D, Ergocalciferol, (DRISDOL) 1.25 MG (50000 UNIT) CAPS capsule Take 1 capsule by mouth once a week. 09/06/20  Yes [provider]  amLODipine (NORVASC) 10 MG tablet Take 10 mg by mouth daily. Patient not taking: Reported on 09/11/2020    [provider]  carvedilol (COREG) 12.5 MG tablet Take 12.5 mg by mouth 2 (two) times daily. Patient not taking: Reported on 09/11/2020 07/12/16   [provider]  empagliflozin (JARDIANCE) 25 MG TABS tablet Take 25 mg by mouth daily. Patient not taking: Reported on 09/11/2020    [provider]  glimepiride (AMARYL) 4 MG tablet Take 4 mg by mouth daily with breakfast. Patient not taking: Reported on 09/11/2020    [provider]  isosorbide mononitrate (IMDUR) 30 MG 24 hr tablet Take 1 tablet (30 mg total) by mouth daily. Patient not taking: Reported on 09/11/2020 05/26/18   Milagros LollSudini, Srikar, MD    Physical Exam: Vitals:   09/11/20 0243 09/11/20 0251 09/11/20 0541 09/11/20 0647  BP: (!) 157/98  (!) 147/103   Pulse: 88  77 79  Resp: 16  16 18   Temp: 98.8 F (37.1 C)   98.1 F (36.7 C)  TempSrc: Oral   Oral  SpO2: 98%  97% 96%  Weight:  76.2 kg    Height:  5\' 6"  (1.676 m)       Vitals:   09/11/20 0243 09/11/20 0251 09/11/20 0541 09/11/20 0647  BP: (!) 157/98  (!) 147/103   Pulse: 88  77 79  Resp: 16  16 18   Temp: 98.8 F (37.1 C)   98.1 F (36.7 C)  TempSrc: Oral   Oral  SpO2: 98%  97% 96%  Weight:  76.2 kg    Height:  5\' 6"  (1.676 m)        Constitutional: Alert and oriented  x 3 . Not in any apparent distress HEENT:      Head: Normocephalic and atraumatic.    Swelling involving the left eyelid extending to the left side of the face.       Eyes: PERLA, EOMI, Conjunctivae are normal. Sclera is non-icteric.       Mouth/Throat: Mucous membranes are moist.       Neck: Supple with no signs of meningismus. Cardiovascular: Regular rate and rhythm. No murmurs, gallops, or rubs. 2+ symmetrical distal pulses are present . No JVD. No LE edema Respiratory: Respiratory effort normal .Lungs sounds clear bilaterally. No wheezes, crackles, or rhonchi.  Gastrointestinal: Soft, non tender, and non distended with positive bowel sounds.  Genitourinary:  No CVA tenderness. Musculoskeletal: Nontender with normal range of motion in all extremities. No cyanosis, or erythema of extremities. Neurologic:  Face is symmetric. Moving all extremities. No gross focal neurologic deficits . Skin: Skin is warm, dry.  No rash or ulcers Psychiatric: Mood and affect are normal   Labs on Admission: I have personally reviewed following labs and imaging studies  CBC: Recent Labs  Lab 09/11/20 0534  WBC 11.4*  NEUTROABS 6.3  HGB 15.2  HCT 44.6  MCV 91.6  PLT 312   Basic Metabolic Panel: Recent Labs  Lab 09/11/20 0534  NA 138  K 3.6  CL 106  CO2 24  GLUCOSE 112*  BUN 14  CREATININE 0.97  CALCIUM 9.3   GFR: Estimated Creatinine Clearance: 78.6 mL/min (by C-G formula based on SCr of 0.97 mg/dL). Liver Function Tests: No results for input(s): AST, ALT, ALKPHOS, BILITOT, PROT, ALBUMIN in the last 168 hours. No results for input(s): LIPASE, AMYLASE in the last 168 hours. No results for input(s): AMMONIA in the last 168 hours. Coagulation Profile: No results for input(s): INR, PROTIME in the last 168 hours. Cardiac Enzymes: No results for input(s): CKTOTAL, CKMB, CKMBINDEX, TROPONINI in the last 168 hours. BNP (last 3 results) No results for input(s): PROBNP in the last 8760  hours. HbA1C: No results for input(s): HGBA1C in the last 72 hours. CBG: No results for input(s): GLUCAP in the last 168 hours. Lipid Profile: No results for input(s): CHOL, HDL, LDLCALC, TRIG, CHOLHDL, LDLDIRECT in the last 72 hours. Thyroid Function Tests: No results for input(s): TSH, T4TOTAL, FREET4, T3FREE, THYROIDAB in the last 72 hours. Anemia Panel: No results for input(s): VITAMINB12, FOLATE, FERRITIN, TIBC, IRON, RETICCTPCT in the last 72 hours. Urine analysis: No results found for: COLORURINE, APPEARANCEUR, LABSPEC, PHURINE, GLUCOSEU, HGBUR, BILIRUBINUR, KETONESUR, PROTEINUR, UROBILINOGEN, NITRITE, LEUKOCYTESUR  Radiological Exams on Admission: CT Maxillofacial Wo Contrast  Result Date: 09/11/2020 CLINICAL DATA:  Cellulitis, face EXAM: CT MAXILLOFACIAL WITHOUT CONTRAST TECHNIQUE: Multidetector CT imaging of the maxillofacial structures was performed. Multiplanar CT image reconstructions were also generated. COMPARISON:  CT head 04/03/2009 FINDINGS: Osseous: No cortical erosion or destruction. No fracture or mandibular dislocation. No destructive process. Patient is edentulous other than a single eroded right maxillary tooth with periapical lucency. Orbits: Negative. No traumatic or inflammatory finding. Sinuses: Paranasal sinuses are clear. There is persistent trace effusion within the left mastoid air cells. Soft tissues: Left periorbital subcutaneus soft tissue edema and dermal thickening extending inferiorly to the left maxillary soft tissues. No organized fluid collection. No emphysema. Limited intracranial: No significant or unexpected finding. IMPRESSION: Left face findings consistent with periorbital cellulitis extending to the left maxillary soft tissues. No organized fluid collection. Electronically Signed   By: Tish Frederickson M.D.   On: 09/11/2020 06:05     Assessment/Plan Principal Problem:   Facial cellulitis Active Problems:   Coronary artery disease    Hypertension   Diabetes mellitus without complication (HCC)   Nicotine dependence     Facial cellulitis Initially started as preseptal cellulitis with extension to the left side of the face associated with mild leukocytosis. Patient's risk factors include diabetes mellitus We will treat patient empirically with vancomycin and Rocephin Follow-up results of blood cultures     Diabetes mellitus Place patient on consistent carbohydrate diet Glycemic control with sliding scale insulin    Hypertension Continue carvedilol, amlodipine and enalapril   History of coronary artery disease Continue aspirin, Brilinta, statins, carvedilol and nitrates    Nicotine dependence Smoking  cessation has been discussed with patient in detail We will place patient on a nicotine transdermal patch 21 mg daily      DVT prophylaxis: SCD Code Status: full code Family Communication: Greater than 50% of time was spent discussing patient's condition and plan of care with him at the bedside.  All questions and concerns have been addressed.  He verbalizes understanding and agrees with the plan. Disposition Plan: Back to previous home environment Consults called: none Status: At the time of admission, it appears that the appropriate admission status for this patient is inpatient. This is judged to be reasonable and necessary in order to provide the required intensity of service to ensure the patient's safety given the presenting symptoms, physical exam findings, and initial radiographic and laboratory data in the context of their comorbid conditions. Patient requires inpatient status due to high intensity of service, high risk for further deterioration and high frequency of surveillance required.    Lucile Shutters MD [facility]     09/11/2020, 8:51 AM

## 2020-09-11 NOTE — ED Triage Notes (Addendum)
Pt states he has had left eye swelling that has been getting worse throughout the day, pt states it is painful tot the touch. Pt denies any vision changes. Pt noted to have swelling to top eyelid on left eye. Pt also states he has had drainage coming from eye

## 2020-09-11 NOTE — Progress Notes (Signed)
Pharmacy Antibiotic Note  Tyler Morales is a 55 y.o. male admitted on 09/11/2020 with facial  cellulitis.  Pharmacy has been consulted for vancomycin dosing. His renal function is stable and at apparent baseline. In the ED he received 1 gram IV ceftriaxone and 1000 mg IV vancomycin  Plan: give additional 500 mg IV vancomycin to complete loading dose then 1000 mg IV Q 12 hrs   Goal AUC 400-550  Expected AUC: 525.1  SCr used: 0.97 mg/dL  Ke: 3.086 h-1, V7/8: 10 h  Css: 32.3 / 15.0 mcg/mL  Daily renal function assessment while on IV vancomycin  Height: 5\' 6"  (167.6 cm) Weight: 76.2 kg (168 lb) IBW/kg (Calculated) : 63.8  Temp (24hrs), Avg:98.3 F (36.8 C), Min:98.1 F (36.7 C), Max:98.8 F (37.1 C)  Recent Labs  Lab 09/11/20 0534  WBC 11.4*  CREATININE 0.97  LATICACIDVEN 1.2    Estimated Creatinine Clearance: 78.6 mL/min (by C-G formula based on SCr of 0.97 mg/dL).    No Known Allergies  Antimicrobials this admission: vancomycin 5/25 >>  ceftriaxone 5/25 >>   Microbiology results: 5/25 BCx: NG < 12 hours 5/25 SARS CoV-2: negative 5/25 influenza A/B  Thank you for allowing pharmacy to be a part of this patient's care.  6/25 09/11/2020 10:49 AM

## 2020-09-12 DIAGNOSIS — E118 Type 2 diabetes mellitus with unspecified complications: Secondary | ICD-10-CM

## 2020-09-12 DIAGNOSIS — I2510 Atherosclerotic heart disease of native coronary artery without angina pectoris: Secondary | ICD-10-CM | POA: Diagnosis not present

## 2020-09-12 DIAGNOSIS — E1169 Type 2 diabetes mellitus with other specified complication: Secondary | ICD-10-CM

## 2020-09-12 DIAGNOSIS — L03211 Cellulitis of face: Secondary | ICD-10-CM | POA: Diagnosis not present

## 2020-09-12 DIAGNOSIS — E785 Hyperlipidemia, unspecified: Secondary | ICD-10-CM

## 2020-09-12 DIAGNOSIS — L03213 Periorbital cellulitis: Secondary | ICD-10-CM

## 2020-09-12 LAB — BASIC METABOLIC PANEL
Anion gap: 8 (ref 5–15)
BUN: 14 mg/dL (ref 6–20)
CO2: 24 mmol/L (ref 22–32)
Calcium: 8.7 mg/dL — ABNORMAL LOW (ref 8.9–10.3)
Chloride: 108 mmol/L (ref 98–111)
Creatinine, Ser: 1.05 mg/dL (ref 0.61–1.24)
GFR, Estimated: >60 mL/min (ref >60)
Glucose, Bld: 118 mg/dL — ABNORMAL HIGH (ref 70–99)
Potassium: 3.1 mmol/L — ABNORMAL LOW (ref 3.5–5.1)
Sodium: 140 mmol/L (ref 135–145)

## 2020-09-12 LAB — CBC
HCT: 42 % (ref 39.0–52.0)
Hemoglobin: 14.2 g/dL (ref 13.0–17.0)
MCH: 30.7 pg (ref 26.0–34.0)
MCHC: 33.8 g/dL (ref 30.0–36.0)
MCV: 90.7 fL (ref 80.0–100.0)
Platelets: 311 10*3/uL (ref 150–400)
RBC: 4.63 MIL/uL (ref 4.22–5.81)
RDW: 13.7 % (ref 11.5–15.5)
WBC: 10.4 10*3/uL (ref 4.0–10.5)
nRBC: 0 % (ref 0.0–0.2)

## 2020-09-12 LAB — GLUCOSE, CAPILLARY: Glucose-Capillary: 103 mg/dL — ABNORMAL HIGH (ref 70–99)

## 2020-09-12 MED ORDER — DEXAMETHASONE SODIUM PHOSPHATE 10 MG/ML IJ SOLN
10.0000 mg | Freq: Once | INTRAMUSCULAR | Status: AC
  Administered 2020-09-12: 10 mg via INTRAVENOUS
  Filled 2020-09-12: qty 1

## 2020-09-12 MED ORDER — POTASSIUM CHLORIDE CRYS ER 20 MEQ PO TBCR
40.0000 meq | EXTENDED_RELEASE_TABLET | Freq: Once | ORAL | Status: AC
  Administered 2020-09-12: 40 meq via ORAL
  Filled 2020-09-12: qty 2

## 2020-09-12 MED ORDER — SULFAMETHOXAZOLE-TRIMETHOPRIM 800-160 MG PO TABS: 1.0000 | ORAL_TABLET | Freq: Two times a day (BID) | ORAL | 0 refills | Status: AC

## 2020-09-12 MED ORDER — PREDNISONE 10 MG PO TABS: ORAL_TABLET | ORAL | 0 refills | Status: DC

## 2020-09-12 NOTE — Discharge Summary (Signed)
[facility] [place] [facility]   PATIENT NAME: Tyler ChiquitoKeith Wroe    MR#:  [id number]  DATE OF BIRTH:  30-May-1965  DATE OF ADMISSION:  09/11/2020 ADMITTING PHYSICIAN: Alford Highlandichard Cherysh Epperly, MD  DATE OF DISCHARGE: 09/12/2020 11:23 AM  PRIMARY CARE PHYSICIAN: Margaretann LovelessKhan, Neelam S, MD    ADMISSION DIAGNOSIS:  Facial cellulitis [L03.211] Preseptal cellulitis of right eye [L03.213]  DISCHARGE DIAGNOSIS:  Principal Problem:   Facial cellulitis Active Problems:   Coronary artery disease   Hypertension   Diabetes mellitus without complication (HCC)   Nicotine dependence   SECONDARY DIAGNOSIS:   Past Medical History:  Diagnosis Date  . Cerebral aneurysm without rupture   . Chronic kidney disease   . Coronary artery disease   . Diabetes mellitus without complication (HCC)   . Hyperlipidemia   . Hypertension   . Myocardial infarction [facility](HCC)     HOSPITAL COURSE:   1.  Left periorbital and facial cellulitis.  Patient was placed on Rocephin and vancomycin here in the hospital.  Patient's left eyelid is swollen.  Swelling has gone down on his left face.  Patient was interested in getting out of the hospital.  He received his last dose of vancomycin the morning of discharge.  We will switch over to Bactrim upon disposition.  We will also give prednisone. 2.  Type 2 diabetes mellitus with hyperlipidemia.  Patient on Rybelsus and [place]Farxiga.  Sugars may be a little high on prednisone.  Continue atorvastatin 3.  History of CAD on aspirin, Brilinta Coreg and atorvastatin 4.  Essential hypertension on Coreg and enalapril 5.  Vitamin D deficiency on vitamin D supplementation   DISCHARGE CONDITIONS:   Satisfactory  CONSULTS OBTAINED:  None  DRUG ALLERGIES:  No Known Allergies  DISCHARGE MEDICATIONS:   Allergies as of 09/12/2020   No Known Allergies     Medication List    TAKE these medications   aspirin 81 MG EC tablet Take 1 tablet (81 mg total) by mouth daily.    atorvastatin 80 MG tablet Commonly known as: LIPITOR Take 1 tablet (80 mg total) by mouth daily at 6 PM.   Brilinta 60 MG Tabs tablet Generic drug: ticagrelor Take 60 mg by mouth 2 (two) times daily.   carvedilol 25 MG tablet Commonly known as: COREG Take 25 mg by mouth 2 (two) times daily.   enalapril 10 MG tablet Commonly known as: VASOTEC Take 10 mg by mouth daily.   Farxiga 10 MG Tabs tablet Generic drug: dapagliflozin propanediol Take 10 mg by mouth daily.   predniSONE 10 MG tablet Commonly known as: DELTASONE Three tabs po day1; two tabs po day2,3; 1 tab po day4,5 then stop   Rybelsus 7 MG Tabs Generic drug: Semaglutide Take 1 tablet by mouth every morning.   sulfamethoxazole-trimethoprim 800-160 MG tablet Commonly known as: BACTRIM DS Take 1 tablet by mouth 2 (two) times daily for 9 days.   Vitamin D (Ergocalciferol) 1.25 MG (50000 UNIT) Caps capsule Commonly known as: DRISDOL Take 1 capsule by mouth once a week.        DISCHARGE INSTRUCTIONS:   Follow-up with PMD 5 days  If you experience worsening of your admission symptoms, develop shortness of breath, life threatening emergency, suicidal or homicidal thoughts you must seek medical attention immediately by calling 911 or calling your MD immediately  if symptoms less severe.  You Must read complete instructions/literature along with all the possible adverse reactions/side effects for all the Medicines you take and  that have been prescribed to you. Take any new Medicines after you have completely understood and accept all the possible adverse reactions/side effects.   Please note  You were cared for by a hospitalist during your hospital stay. If you have any questions about your discharge medications or the care you received while you were in the hospital after you are discharged, you can call the unit and asked to speak with the hospitalist on call if the hospitalist that took care of you is not available.  Once you are discharged, your primary care physician will handle any further medical issues. Please note that NO REFILLS for any discharge medications will be authorized once you are discharged, as it is imperative that you return to your primary care physician (or establish a relationship with a primary care physician if you do not have one) for your aftercare needs so that they can reassess your need for medications and monitor your lab values.    Today   CHIEF COMPLAINT:   Chief Complaint  Patient presents with  . eye swelling    HISTORY OF PRESENT ILLNESS:  Clide Remmers  is a 55 y.o. male came in with eyelid swelling and facial cellulitis   VITAL SIGNS:  Blood pressure (!) 145/98, pulse 71, temperature 99.1 F (37.3 C), temperature source Oral, resp. rate 18, height 5\' 6"  (1.676 m), weight 76.2 kg, SpO2 100 %.  I/O:    Intake/Output Summary (Last 24 hours) at 09/12/2020 1734 Last data filed at 09/12/2020 0951 Gross per 24 hour  Intake 671.14 ml  Output 0 ml  Net 671.14 ml    PHYSICAL EXAMINATION:  GENERAL:  55 y.o.-year-old patient lying in the bed with no acute distress.  EYES: Pupils equal, round, reactive to light and accommodation. No scleral icterus. Extraocular muscles intact.  Left eyelid swelling. HEENT: Head atraumatic, normocephalic. Oropharynx and nasopharynx clear.   LUNGS: Normal breath sounds bilaterally, no wheezing, rales,rhonchi or crepitation. No use of accessory muscles of respiration.  CARDIOVASCULAR: S1, S2 normal. No murmurs, rubs, or gallops.  ABDOMEN: Soft, non-tender, non-distended. Bowel sounds present. No organomegaly or mass.  EXTREMITIES: No pedal edema, cyanosis, or clubbing.  NEUROLOGIC: Cranial nerves II through XII are intact. Muscle strength 5/5 in all extremities. Sensation intact. Gait not checked.  PSYCHIATRIC: The patient is alert and oriented x 3.  SKIN: Slight erythema around his eye and face.  DATA REVIEW:   CBC Recent Labs   Lab 09/12/20 0350  WBC 10.4  HGB 14.2  HCT 42.0  PLT 311    Chemistries  Recent Labs  Lab 09/12/20 0350  NA 140  K 3.1*  CL 108  CO2 24  GLUCOSE 118*  BUN 14  CREATININE 1.05  CALCIUM 8.7*    Microbiology Results  Results for orders placed or performed during the hospital encounter of 09/11/20  Culture, blood (Routine X 2) w Reflex to ID Panel     Status: None (Preliminary result)   Collection Time: 09/11/20  5:34 AM   Specimen: BLOOD  Result Value Ref Range Status   Specimen Description BLOOD LEFT FA  Final   Special Requests   Final    BOTTLES DRAWN AEROBIC AND ANAEROBIC Blood Culture adequate volume   Culture   Final    NO GROWTH 1 DAY Performed at [facility], [address] [place], [place]    Report Status PENDING  Incomplete  Culture, blood (Routine X 2) w Reflex to ID Panel  Status: None (Preliminary result)   Collection Time: 09/11/20  5:34 AM   Specimen: BLOOD  Result Value Ref Range Status   Specimen Description BLOOD LEFT AC  Final   Special Requests   Final    BOTTLES DRAWN AEROBIC AND ANAEROBIC Blood Culture adequate volume   Culture   Final    NO GROWTH 1 DAY Performed at [facility], [address], [place], [place] [address]    Report Status PENDING  Incomplete  Resp Panel by RT-PCR (Flu A&B, Covid) Nasopharyngeal Swab     Status: None   Collection Time: 09/11/20  5:34 AM   Specimen: Nasopharyngeal Swab; Nasopharyngeal(NP) swabs in vial transport medium  Result Value Ref Range Status   SARS Coronavirus 2 by RT PCR NEGATIVE NEGATIVE Final    Comment: (NOTE) SARS-CoV-2 target nucleic acids are NOT DETECTED.  The SARS-CoV-2 RNA is generally detectable in upper respiratory specimens during the acute phase of infection. The lowest concentration of SARS-CoV-2 viral copies this assay can detect is 138 copies/mL. A negative result does not preclude SARS-Cov-2 infection and should not be used as the sole basis  for treatment or other patient management decisions. A negative result may occur with  improper specimen collection/handling, submission of specimen other than nasopharyngeal swab, presence of viral mutation(s) within the areas targeted by this assay, and inadequate number of viral copies(<138 copies/mL). A negative result must be combined with clinical observations, patient history, and epidemiological information. The expected result is Negative.  Fact Sheet for Patients:  BloggerCourse.com  Fact Sheet for Healthcare Providers:  SeriousBroker.it  This test is no t yet approved or cleared by the [place] FDA and  has been authorized for detection and/or diagnosis of SARS-CoV-2 by FDA under an Emergency Use Authorization (EUA). This EUA will remain  in effect (meaning this test can be used) for the duration of the COVID-19 declaration under Section 564(b)(1) of the Act, 21 U.S.C.section 360bbb-3(b)(1), unless the authorization is terminated  or revoked sooner.       Influenza A by PCR NEGATIVE NEGATIVE Final   Influenza B by PCR NEGATIVE NEGATIVE Final    Comment: (NOTE) The Xpert Xpress SARS-CoV-2/FLU/RSV plus assay is intended as an aid in the diagnosis of influenza from Nasopharyngeal swab specimens and should not be used as a sole basis for treatment. Nasal washings and aspirates are unacceptable for Xpert Xpress SARS-CoV-2/FLU/RSV testing.  Fact Sheet for Patients: BloggerCourse.com  Fact Sheet for Healthcare Providers: SeriousBroker.it  This test is not yet approved or cleared by the [place] FDA and has been authorized for detection and/or diagnosis of SARS-CoV-2 by FDA under an Emergency Use Authorization (EUA). This EUA will remain in effect (meaning this test can be used) for the duration of the COVID-19 declaration under Section 564(b)(1) of the Act, 21  U.S.C. section 360bbb-3(b)(1), unless the authorization is terminated or revoked.  Performed at [facility], [address] [place], [place] [address]     RADIOLOGY:  CT Maxillofacial Wo Contrast  Result Date: 09/11/2020 CLINICAL DATA:  Cellulitis, face EXAM: CT MAXILLOFACIAL WITHOUT CONTRAST TECHNIQUE: Multidetector CT imaging of the maxillofacial structures was performed. Multiplanar CT image reconstructions were also generated. COMPARISON:  CT head 04/03/2009 FINDINGS: Osseous: No cortical erosion or destruction. No fracture or mandibular dislocation. No destructive process. Patient is edentulous other than a single eroded right maxillary tooth with periapical lucency. Orbits: Negative. No traumatic or inflammatory finding. Sinuses: Paranasal sinuses are clear. There is persistent trace effusion  within the left mastoid air cells. Soft tissues: Left periorbital subcutaneus soft tissue edema and dermal thickening extending inferiorly to the left maxillary soft tissues. No organized fluid collection. No emphysema. Limited intracranial: No significant or unexpected finding. IMPRESSION: Left face findings consistent with periorbital cellulitis extending to the left maxillary soft tissues. No organized fluid collection. Electronically Signed   By: Tish Frederickson M.D.   On: 09/11/2020 06:05     Management plans discussed with the patient, and he is in agreement.  CODE STATUS:  Code Status History    Date Active Date Inactive Code Status Order ID Comments User Context   09/11/2020 0840 09/12/2020 1623 Full Code [id number]  Lucile Shutters, MD ED   05/25/2018 1623 05/26/2018 1959 Full Code [id number]  Altamese Dilling, MD Inpatient   09/24/2015 0611 09/25/2015 1535 Full Code [id number]  Arnaldo Natal, MD ED   Advance Care Planning Activity    Questions for Most Recent Historical Code Status (Order [id number])       TOTAL TIME TAKING CARE OF THIS PATIENT: 34 minutes.    Alford Highland M.D on 09/12/2020 at 5:34 PM  Between 7am to 6pm - Pager - [phone]  After 6pm go to [email or web address] - password [email or web address]  [facility]  CC: Primary care physician; Margaretann Loveless, MD

## 2020-09-13 LAB — CULTURE, BLOOD (ROUTINE X 2)

## 2020-09-15 LAB — CULTURE, BLOOD (ROUTINE X 2): Culture: NO GROWTH

## 2020-09-16 LAB — CULTURE, BLOOD (ROUTINE X 2)
Culture: NO GROWTH
Special Requests: ADEQUATE
Special Requests: ADEQUATE

## 2021-02-24 ENCOUNTER — Encounter: Payer: Self-pay | Admitting: Cardiology

## 2021-02-24 ENCOUNTER — Inpatient Hospital Stay (HOSPITAL_COMMUNITY): Payer: BC Managed Care – PPO

## 2021-02-24 ENCOUNTER — Encounter
Admission: EM | Disposition: A | Discharge status: Home / Self Care | Payer: Self-pay | Source: Home / Self Care | Attending: Emergency Medicine

## 2021-02-24 ENCOUNTER — Emergency Department
Admission: EM | Admit: 2021-02-24 | Discharge: 2021-02-24 | Disposition: A | Discharge status: Home / Self Care | Payer: BC Managed Care – PPO | Attending: Emergency Medicine | Admitting: Emergency Medicine

## 2021-02-24 DIAGNOSIS — Z7902 Long term (current) use of antithrombotics/antiplatelets: Secondary | ICD-10-CM | POA: Diagnosis not present

## 2021-02-24 DIAGNOSIS — I959 Hypotension, unspecified: Secondary | ICD-10-CM | POA: Diagnosis not present

## 2021-02-24 DIAGNOSIS — I252 Old myocardial infarction: Secondary | ICD-10-CM

## 2021-02-24 DIAGNOSIS — I2510 Atherosclerotic heart disease of native coronary artery without angina pectoris: Secondary | ICD-10-CM | POA: Diagnosis present

## 2021-02-24 DIAGNOSIS — G936 Cerebral edema: Secondary | ICD-10-CM | POA: Diagnosis present

## 2021-02-24 DIAGNOSIS — I462 Cardiac arrest due to underlying cardiac condition: Secondary | ICD-10-CM | POA: Diagnosis present

## 2021-02-24 DIAGNOSIS — I469 Cardiac arrest, cause unspecified: Secondary | ICD-10-CM | POA: Insufficient documentation

## 2021-02-24 DIAGNOSIS — R7401 Elevation of levels of liver transaminase levels: Secondary | ICD-10-CM | POA: Diagnosis present

## 2021-02-24 DIAGNOSIS — Z66 Do not resuscitate: Secondary | ICD-10-CM | POA: Diagnosis present

## 2021-02-24 DIAGNOSIS — Z20822 Contact with and (suspected) exposure to covid-19: Secondary | ICD-10-CM | POA: Diagnosis present

## 2021-02-24 DIAGNOSIS — E876 Hypokalemia: Secondary | ICD-10-CM | POA: Diagnosis present

## 2021-02-24 DIAGNOSIS — I10 Essential (primary) hypertension: Secondary | ICD-10-CM

## 2021-02-24 DIAGNOSIS — Z79899 Other long term (current) drug therapy: Secondary | ICD-10-CM | POA: Diagnosis not present

## 2021-02-24 DIAGNOSIS — Z7982 Long term (current) use of aspirin: Secondary | ICD-10-CM

## 2021-02-24 DIAGNOSIS — Z7984 Long term (current) use of oral hypoglycemic drugs: Secondary | ICD-10-CM

## 2021-02-24 DIAGNOSIS — Z452 Encounter for adjustment and management of vascular access device: Secondary | ICD-10-CM

## 2021-02-24 DIAGNOSIS — R570 Cardiogenic shock: Secondary | ICD-10-CM

## 2021-02-24 DIAGNOSIS — Z955 Presence of coronary angioplasty implant and graft: Secondary | ICD-10-CM

## 2021-02-24 DIAGNOSIS — I2109 ST elevation (STEMI) myocardial infarction involving other coronary artery of anterior wall: Principal | ICD-10-CM | POA: Diagnosis present

## 2021-02-24 DIAGNOSIS — N179 Acute kidney failure, unspecified: Secondary | ICD-10-CM | POA: Diagnosis present

## 2021-02-24 DIAGNOSIS — G931 Anoxic brain damage, not elsewhere classified: Secondary | ICD-10-CM | POA: Diagnosis present

## 2021-02-24 DIAGNOSIS — F17210 Nicotine dependence, cigarettes, uncomplicated: Secondary | ICD-10-CM | POA: Diagnosis present

## 2021-02-24 DIAGNOSIS — J9690 Respiratory failure, unspecified, unspecified whether with hypoxia or hypercapnia: Secondary | ICD-10-CM | POA: Diagnosis present

## 2021-02-24 DIAGNOSIS — I213 ST elevation (STEMI) myocardial infarction of unspecified site: Secondary | ICD-10-CM | POA: Diagnosis not present

## 2021-02-24 DIAGNOSIS — X58XXXA Exposure to other specified factors, initial encounter: Secondary | ICD-10-CM | POA: Diagnosis present

## 2021-02-24 DIAGNOSIS — J9602 Acute respiratory failure with hypercapnia: Secondary | ICD-10-CM | POA: Diagnosis present

## 2021-02-24 DIAGNOSIS — J9601 Acute respiratory failure with hypoxia: Secondary | ICD-10-CM | POA: Diagnosis present

## 2021-02-24 DIAGNOSIS — I255 Ischemic cardiomyopathy: Secondary | ICD-10-CM | POA: Diagnosis present

## 2021-02-24 DIAGNOSIS — E785 Hyperlipidemia, unspecified: Secondary | ICD-10-CM

## 2021-02-24 DIAGNOSIS — I161 Hypertensive emergency: Secondary | ICD-10-CM | POA: Diagnosis present

## 2021-02-24 DIAGNOSIS — G9382 Brain death: Secondary | ICD-10-CM | POA: Diagnosis present

## 2021-02-24 DIAGNOSIS — E1122 Type 2 diabetes mellitus with diabetic chronic kidney disease: Secondary | ICD-10-CM | POA: Diagnosis present

## 2021-02-24 DIAGNOSIS — N189 Chronic kidney disease, unspecified: Secondary | ICD-10-CM | POA: Diagnosis present

## 2021-02-24 DIAGNOSIS — Z794 Long term (current) use of insulin: Secondary | ICD-10-CM

## 2021-02-24 DIAGNOSIS — E118 Type 2 diabetes mellitus with unspecified complications: Secondary | ICD-10-CM

## 2021-02-24 DIAGNOSIS — I130 Hypertensive heart and chronic kidney disease with heart failure and stage 1 through stage 4 chronic kidney disease, or unspecified chronic kidney disease: Secondary | ICD-10-CM | POA: Diagnosis present

## 2021-02-24 DIAGNOSIS — Z833 Family history of diabetes mellitus: Secondary | ICD-10-CM

## 2021-02-24 DIAGNOSIS — I4901 Ventricular fibrillation: Secondary | ICD-10-CM | POA: Diagnosis present

## 2021-02-24 DIAGNOSIS — E1169 Type 2 diabetes mellitus with other specified complication: Secondary | ICD-10-CM

## 2021-02-24 DIAGNOSIS — I472 Ventricular tachycardia, unspecified: Secondary | ICD-10-CM | POA: Diagnosis present

## 2021-02-24 DIAGNOSIS — S20212A Contusion of left front wall of thorax, initial encounter: Secondary | ICD-10-CM | POA: Diagnosis present

## 2021-02-24 DIAGNOSIS — I5021 Acute systolic (congestive) heart failure: Secondary | ICD-10-CM | POA: Diagnosis present

## 2021-02-24 DIAGNOSIS — N181 Chronic kidney disease, stage 1: Secondary | ICD-10-CM | POA: Diagnosis present

## 2021-02-24 DIAGNOSIS — I509 Heart failure, unspecified: Secondary | ICD-10-CM

## 2021-02-24 HISTORY — PX: RIGHT/LEFT HEART CATH AND CORONARY ANGIOGRAPHY: CATH118266

## 2021-02-24 HISTORY — PX: CORONARY/GRAFT ACUTE MI REVASCULARIZATION: CATH118305

## 2021-02-24 LAB — COMPREHENSIVE METABOLIC PANEL
ALT: 177 U/L — ABNORMAL HIGH (ref 0–44)
AST: 414 U/L — ABNORMAL HIGH (ref 15–41)
Albumin: 3.1 g/dL — ABNORMAL LOW (ref 3.5–5.0)
Alkaline Phosphatase: 39 U/L (ref 38–126)
Anion gap: 13 (ref 5–15)
BUN: 29 mg/dL — ABNORMAL HIGH (ref 6–20)
CO2: 19 mmol/L — ABNORMAL LOW (ref 22–32)
Calcium: 7.7 mg/dL — ABNORMAL LOW (ref 8.9–10.3)
Chloride: 103 mmol/L (ref 98–111)
Creatinine, Ser: 1.66 mg/dL — ABNORMAL HIGH (ref 0.61–1.24)
GFR, Estimated: 49 mL/min — ABNORMAL LOW (ref >60)
Glucose, Bld: 368 mg/dL — ABNORMAL HIGH (ref 70–99)
Potassium: 4.2 mmol/L (ref 3.5–5.1)
Sodium: 135 mmol/L (ref 135–145)
Total Bilirubin: 1.2 mg/dL (ref 0.3–1.2)
Total Protein: 6.1 g/dL — ABNORMAL LOW (ref 6.5–8.1)

## 2021-02-24 LAB — CBC
HCT: 39.3 % (ref 39.0–52.0)
Hemoglobin: 13.8 g/dL (ref 13.0–17.0)
MCH: 31.2 pg (ref 26.0–34.0)
MCHC: 35.1 g/dL (ref 30.0–36.0)
MCV: 88.7 fL (ref 80.0–100.0)
Platelets: 305 10*3/uL (ref 150–400)
RBC: 4.43 MIL/uL (ref 4.22–5.81)
RDW: 14.2 % (ref 11.5–15.5)
WBC: 20.2 10*3/uL — ABNORMAL HIGH (ref 4.0–10.5)
nRBC: 0 % (ref 0.0–0.2)

## 2021-02-24 LAB — ECHOCARDIOGRAM COMPLETE
AR max vel: 2.83 cm2
AV Area VTI: 3.06 cm2
AV Area mean vel: 2.44 cm2
AV Mean grad: 4 mmHg
AV Peak grad: 6 mmHg
Ao pk vel: 1.22 m/s
Area-P 1/2: 3.46 cm2
Height: 66 in
S' Lateral: 2.62 cm
Weight: 2720 oz

## 2021-02-24 LAB — CBC WITH DIFFERENTIAL/PLATELET
Abs Immature Granulocytes: 0.32 10*3/uL — ABNORMAL HIGH (ref 0.00–0.07)
Basophils Absolute: 0.1 10*3/uL (ref 0.0–0.1)
Basophils Relative: 1 %
Eosinophils Absolute: 0.2 10*3/uL (ref 0.0–0.5)
Eosinophils Relative: 1 %
HCT: 37.7 % — ABNORMAL LOW (ref 39.0–52.0)
Hemoglobin: 12.5 g/dL — ABNORMAL LOW (ref 13.0–17.0)
Immature Granulocytes: 2 %
Lymphocytes Relative: 43 %
Lymphs Abs: 6.3 10*3/uL — ABNORMAL HIGH (ref 0.7–4.0)
MCH: 31.3 pg (ref 26.0–34.0)
MCHC: 33.2 g/dL (ref 30.0–36.0)
MCV: 94.3 fL (ref 80.0–100.0)
Monocytes Absolute: 0.5 10*3/uL (ref 0.1–1.0)
Monocytes Relative: 3 %
Neutro Abs: 7.2 10*3/uL (ref 1.7–7.7)
Neutrophils Relative %: 50 %
Platelets: 214 10*3/uL (ref 150–400)
RBC: 4 MIL/uL — ABNORMAL LOW (ref 4.22–5.81)
RDW: 14.4 % (ref 11.5–15.5)
Smear Review: NORMAL
WBC: 14.5 10*3/uL — ABNORMAL HIGH (ref 4.0–10.5)
nRBC: 0.2 % (ref 0.0–0.2)

## 2021-02-24 LAB — COOXEMETRY PANEL
Carboxyhemoglobin: 0.5 % (ref 0.5–1.5)
Carboxyhemoglobin: 0.6 % (ref 0.5–1.5)
Methemoglobin: 0.8 % (ref 0.0–1.5)
Methemoglobin: 0.8 % (ref 0.0–1.5)
O2 Saturation: 59.6 %
O2 Saturation: 53.6 %
Total hemoglobin: 13.7 g/dL (ref 12.0–16.0)
Total hemoglobin: 13.2 g/dL (ref 12.0–16.0)

## 2021-02-24 LAB — GLUCOSE, CAPILLARY
Glucose-Capillary: 115 mg/dL — ABNORMAL HIGH (ref 70–99)
Glucose-Capillary: 129 mg/dL — ABNORMAL HIGH (ref 70–99)
Glucose-Capillary: 144 mg/dL — ABNORMAL HIGH (ref 70–99)
Glucose-Capillary: 154 mg/dL — ABNORMAL HIGH (ref 70–99)
Glucose-Capillary: 171 mg/dL — ABNORMAL HIGH (ref 70–99)
Glucose-Capillary: 204 mg/dL — ABNORMAL HIGH (ref 70–99)
Glucose-Capillary: 251 mg/dL — ABNORMAL HIGH (ref 70–99)
Glucose-Capillary: 256 mg/dL — ABNORMAL HIGH (ref 70–99)
Glucose-Capillary: 292 mg/dL — ABNORMAL HIGH (ref 70–99)
Glucose-Capillary: 304 mg/dL — ABNORMAL HIGH (ref 70–99)
Glucose-Capillary: 337 mg/dL — ABNORMAL HIGH (ref 70–99)
Glucose-Capillary: 363 mg/dL — ABNORMAL HIGH (ref 70–99)

## 2021-02-24 LAB — POCT I-STAT 7, (LYTES, BLD GAS, ICA,H+H)
Acid-base deficit: 2 mmol/L (ref 0.0–2.0)
Bicarbonate: 23 mmol/L (ref 20.0–28.0)
Calcium, Ion: 1.05 mmol/L — ABNORMAL LOW (ref 1.15–1.40)
HCT: 41 % (ref 39.0–52.0)
Hemoglobin: 13.9 g/dL (ref 13.0–17.0)
O2 Saturation: 99 %
Patient temperature: 35.1
Potassium: 4.1 mmol/L (ref 3.5–5.1)
Sodium: 137 mmol/L (ref 135–145)
TCO2: 24 mmol/L (ref 22–32)
pCO2 arterial: 35.8 mmHg (ref 32.0–48.0)
pH, Arterial: 7.407 (ref 7.350–7.450)
pO2, Arterial: 131 mmHg — ABNORMAL HIGH (ref 83.0–108.0)

## 2021-02-24 LAB — BASIC METABOLIC PANEL
Anion gap: 12 (ref 5–15)
BUN: 22 mg/dL — ABNORMAL HIGH (ref 6–20)
CO2: 26 mmol/L (ref 22–32)
Calcium: 7.9 mg/dL — ABNORMAL LOW (ref 8.9–10.3)
Chloride: 101 mmol/L (ref 98–111)
Creatinine, Ser: 1.62 mg/dL — ABNORMAL HIGH (ref 0.61–1.24)
GFR, Estimated: 50 mL/min — ABNORMAL LOW (ref >60)
Glucose, Bld: 383 mg/dL — ABNORMAL HIGH (ref 70–99)
Potassium: 4.6 mmol/L (ref 3.5–5.1)
Sodium: 139 mmol/L (ref 135–145)

## 2021-02-24 LAB — LIPID PANEL
Cholesterol: 109 mg/dL (ref 0–200)
HDL: 31 mg/dL — ABNORMAL LOW (ref >40)
LDL Cholesterol: 62 mg/dL (ref 0–99)
Total CHOL/HDL Ratio: 3.5 RATIO
Triglycerides: 78 mg/dL (ref <150)
VLDL: 16 mg/dL (ref 0–40)

## 2021-02-24 LAB — RESP PANEL BY RT-PCR (FLU A&B, COVID) ARPGX2
Influenza A by PCR: NEGATIVE
Influenza B by PCR: NEGATIVE
SARS Coronavirus 2 by RT PCR: NEGATIVE

## 2021-02-24 LAB — BRAIN NATRIURETIC PEPTIDE: B Natriuretic Peptide: 122.1 pg/mL — ABNORMAL HIGH (ref 0.0–100.0)

## 2021-02-24 LAB — RAPID URINE DRUG SCREEN, HOSP PERFORMED
Amphetamines: NOT DETECTED
Barbiturates: NOT DETECTED
Benzodiazepines: POSITIVE — AB
Cocaine: POSITIVE — AB
Opiates: NOT DETECTED
Tetrahydrocannabinol: NOT DETECTED

## 2021-02-24 LAB — POCT ACTIVATED CLOTTING TIME
Activated Clotting Time: 231 seconds
Activated Clotting Time: 283 seconds
Activated Clotting Time: 219 seconds
Activated Clotting Time: 231 seconds
Activated Clotting Time: 248 seconds
Activated Clotting Time: 208 seconds

## 2021-02-24 LAB — HEMOGLOBIN A1C
Hgb A1c MFr Bld: 6.5 % — ABNORMAL HIGH (ref 4.8–5.6)
Mean Plasma Glucose: 139.85 mg/dL

## 2021-02-24 LAB — LACTIC ACID, PLASMA
Lactic Acid, Venous: 2.6 mmol/L (ref 0.5–1.9)
Lactic Acid, Venous: 3.3 mmol/L (ref 0.5–1.9)

## 2021-02-24 LAB — APTT: aPTT: >200 seconds (ref 24–36)

## 2021-02-24 LAB — PROTIME-INR
INR: 1.3 — ABNORMAL HIGH (ref 0.8–1.2)
Prothrombin Time: 16.4 seconds — ABNORMAL HIGH (ref 11.4–15.2)

## 2021-02-24 LAB — MAGNESIUM: Magnesium: 2.2 mg/dL (ref 1.7–2.4)

## 2021-02-24 LAB — MRSA NEXT GEN BY PCR, NASAL: MRSA by PCR Next Gen: DETECTED — AB

## 2021-02-24 LAB — TROPONIN I (HIGH SENSITIVITY): Troponin I (High Sensitivity): 101 ng/L (ref <18)

## 2021-02-24 SURGERY — CORONARY/GRAFT ACUTE MI REVASCULARIZATION
Anesthesia: Moderate Sedation

## 2021-02-24 MED ORDER — DOCUSATE SODIUM 50 MG/5ML PO LIQD
100.0000 mg | Freq: Two times a day (BID) | ORAL | Status: DC
  Administered 2021-02-24: 100 mg
  Filled 2021-02-24 (×2): qty 10

## 2021-02-24 MED ORDER — AMIODARONE HCL IN DEXTROSE 360-4.14 MG/200ML-% IV SOLN
30.0000 mg/h | INTRAVENOUS | Status: DC
  Administered 2021-02-24 (×3): 60 mg/h via INTRAVENOUS
  Administered 2021-02-25: 30 mg/h via INTRAVENOUS
  Filled 2021-02-24 (×2): qty 200
  Filled 2021-02-24: qty 400
  Filled 2021-02-24: qty 200

## 2021-02-24 MED ORDER — DEXTROSE 50 % IV SOLN: 0.0000 mL | INTRAVENOUS | Status: DC | PRN

## 2021-02-24 MED ORDER — PANTOPRAZOLE SODIUM 40 MG IV SOLR
40.0000 mg | Freq: Every day | INTRAVENOUS | Status: DC
  Administered 2021-02-24 – 2021-02-25 (×2): 40 mg via INTRAVENOUS
  Filled 2021-02-24 (×2): qty 40

## 2021-02-24 MED ORDER — BUSPIRONE HCL 10 MG PO TABS: 30.0000 mg | ORAL_TABLET | Freq: Three times a day (TID) | ORAL | Status: DC

## 2021-02-24 MED ORDER — NOREPINEPHRINE 4 MG/250ML-% IV SOLN
INTRAVENOUS | Status: AC
  Filled 2021-02-24: qty 250

## 2021-02-24 MED ORDER — INSULIN REGULAR(HUMAN) IN NACL 100-0.9 UT/100ML-% IV SOLN
INTRAVENOUS | Status: DC
  Administered 2021-02-24: 11.5 [IU]/h via INTRAVENOUS
  Administered 2021-02-24: 8.5 [IU]/h via INTRAVENOUS
  Filled 2021-02-24 (×2): qty 100

## 2021-02-24 MED ORDER — INSULIN ASPART 100 UNIT/ML IJ SOLN
2.0000 [IU] | INTRAMUSCULAR | Status: DC
Start: 2021-02-25 — End: 2021-02-25
  Administered 2021-02-25 (×2): 4 [IU] via SUBCUTANEOUS

## 2021-02-24 MED ORDER — CALCIUM GLUCONATE-NACL 1-0.675 GM/50ML-% IV SOLN
1.0000 g | Freq: Once | INTRAVENOUS | Status: AC
  Administered 2021-02-24: 1000 mg via INTRAVENOUS
  Filled 2021-02-24: qty 50

## 2021-02-24 MED ORDER — HEPARIN (PORCINE) IN NACL 1000-0.9 UT/500ML-% IV SOLN
INTRAVENOUS | Status: AC
  Filled 2021-02-24: qty 500

## 2021-02-24 MED ORDER — HEPARIN (PORCINE) IN NACL 2000-0.9 UNIT/L-% IV SOLN
INTRAVENOUS | Status: DC | PRN
  Administered 2021-02-24 (×2): 1000 mL

## 2021-02-24 MED ORDER — MIDAZOLAM-SODIUM CHLORIDE 100-0.9 MG/100ML-% IV SOLN
INTRAVENOUS | Status: AC | PRN
  Administered 2021-02-24: 2 mg/h via INTRAVENOUS

## 2021-02-24 MED ORDER — AMIODARONE LOAD VIA INFUSION
150.0000 mg | Freq: Once | INTRAVENOUS | Status: DC
  Filled 2021-02-24: qty 83.34

## 2021-02-24 MED ORDER — CANGRELOR TETRASODIUM 50 MG IV SOLR
INTRAVENOUS | Status: AC
  Filled 2021-02-24: qty 50

## 2021-02-24 MED ORDER — SODIUM CHLORIDE 0.9 % IV BOLUS
500.0000 mL | Freq: Once | INTRAVENOUS | Status: AC
Start: 2021-02-24 — End: 2021-02-24
  Administered 2021-02-24: 500 mL via INTRAVENOUS

## 2021-02-24 MED ORDER — POLYETHYLENE GLYCOL 3350 17 G PO PACK
17.0000 g | PACK | Freq: Every day | ORAL | Status: DC
  Filled 2021-02-24: qty 1

## 2021-02-24 MED ORDER — ACETAMINOPHEN 325 MG PO TABS: 650.0000 mg | ORAL_TABLET | ORAL | Status: DC

## 2021-02-24 MED ORDER — AMIODARONE LOAD VIA INFUSION
150.0000 mg | Freq: Once | INTRAVENOUS | Status: AC
  Administered 2021-02-24: 150 mg via INTRAVENOUS
  Filled 2021-02-24: qty 83.34

## 2021-02-24 MED ORDER — IOHEXOL 350 MG/ML SOLN
INTRAVENOUS | Status: DC | PRN
  Administered 2021-02-24: 145 mL via INTRACARDIAC

## 2021-02-24 MED ORDER — LABETALOL HCL 5 MG/ML IV SOLN
INTRAVENOUS | Status: AC
  Filled 2021-02-24: qty 4

## 2021-02-24 MED ORDER — FENTANYL BOLUS VIA INFUSION
50.0000 ug | INTRAVENOUS | Status: DC | PRN
  Filled 2021-02-24: qty 100

## 2021-02-24 MED ORDER — NOREPINEPHRINE 16 MG/250ML-% IV SOLN
0.0000 ug/min | INTRAVENOUS | Status: DC
  Administered 2021-02-24: 25 ug/min via INTRAVENOUS
  Administered 2021-02-24: 26 ug/min via INTRAVENOUS
  Filled 2021-02-24 (×3): qty 250

## 2021-02-24 MED ORDER — INSULIN DETEMIR 100 UNIT/ML ~~LOC~~ SOLN
12.0000 [IU] | Freq: Two times a day (BID) | SUBCUTANEOUS | Status: DC
  Administered 2021-02-25 (×2): 12 [IU] via SUBCUTANEOUS
  Filled 2021-02-24 (×3): qty 0.12

## 2021-02-24 MED ORDER — PERFLUTREN LIPID MICROSPHERE
1.0000 mL | INTRAVENOUS | Status: AC | PRN
  Administered 2021-02-24: 2 mL via INTRAVENOUS
  Filled 2021-02-24: qty 10

## 2021-02-24 MED ORDER — SODIUM CHLORIDE 0.9% FLUSH
10.0000 mL | Freq: Two times a day (BID) | INTRAVENOUS | Status: DC
  Administered 2021-02-24 – 2021-02-25 (×2): 10 mL

## 2021-02-24 MED ORDER — TICAGRELOR 90 MG PO TABS
ORAL_TABLET | ORAL | Status: AC
  Filled 2021-02-24: qty 2

## 2021-02-24 MED ORDER — MUPIROCIN 2 % EX OINT
1.0000 "application " | TOPICAL_OINTMENT | Freq: Two times a day (BID) | CUTANEOUS | Status: DC
  Administered 2021-02-24 – 2021-02-25 (×2): 1 via NASAL
  Filled 2021-02-24: qty 22

## 2021-02-24 MED ORDER — ACETAMINOPHEN 160 MG/5ML PO SOLN: 650.0000 mg | ORAL | Status: DC

## 2021-02-24 MED ORDER — ASPIRIN 81 MG PO CHEW
CHEWABLE_TABLET | ORAL | Status: AC
  Filled 2021-02-24: qty 4

## 2021-02-24 MED ORDER — MILRINONE LACTATE IN DEXTROSE 20-5 MG/100ML-% IV SOLN
INTRAVENOUS | Status: AC | PRN
  Administered 2021-02-24: .25 ug/kg/min via INTRAVENOUS

## 2021-02-24 MED ORDER — ASPIRIN 81 MG PO CHEW
CHEWABLE_TABLET | ORAL | Status: DC | PRN
  Administered 2021-02-24: 324 mg via ORAL

## 2021-02-24 MED ORDER — HEPARIN SODIUM (PORCINE) 1000 UNIT/ML IJ SOLN
INTRAMUSCULAR | Status: DC | PRN
  Administered 2021-02-24 (×2): 3000 [IU] via INTRAVENOUS

## 2021-02-24 MED ORDER — TICAGRELOR 90 MG PO TABS
90.0000 mg | ORAL_TABLET | Freq: Two times a day (BID) | ORAL | Status: DC
  Administered 2021-02-24 – 2021-02-25 (×2): 90 mg
  Filled 2021-02-24 (×2): qty 1

## 2021-02-24 MED ORDER — NITROGLYCERIN 1 MG/10 ML FOR IR/CATH LAB
INTRA_ARTERIAL | Status: DC | PRN
  Administered 2021-02-24 (×2): 200 ug via INTRACORONARY

## 2021-02-24 MED ORDER — AMIODARONE HCL IN DEXTROSE 360-4.14 MG/200ML-% IV SOLN
INTRAVENOUS | Status: AC
  Filled 2021-02-24: qty 200

## 2021-02-24 MED ORDER — NOREPINEPHRINE BITARTRATE 1 MG/ML IV SOLN
INTRAVENOUS | Status: AC | PRN
  Administered 2021-02-24: 25 ug via INTRAVENOUS
  Administered 2021-02-24: 20 ug via INTRAVENOUS
  Administered 2021-02-24: 40 ug via INTRAVENOUS

## 2021-02-24 MED ORDER — CHLORHEXIDINE GLUCONATE CLOTH 2 % EX PADS
6.0000 | MEDICATED_PAD | Freq: Every day | CUTANEOUS | Status: DC
  Administered 2021-02-24: 6 via TOPICAL

## 2021-02-24 MED ORDER — ATORVASTATIN CALCIUM 80 MG PO TABS
80.0000 mg | ORAL_TABLET | Freq: Every day | ORAL | Status: DC
  Administered 2021-02-24 – 2021-02-25 (×2): 80 mg
  Filled 2021-02-24 (×2): qty 1

## 2021-02-24 MED ORDER — VERAPAMIL HCL 2.5 MG/ML IV SOLN
INTRAVENOUS | Status: AC
  Filled 2021-02-24: qty 2

## 2021-02-24 MED ORDER — FENTANYL CITRATE PF 50 MCG/ML IJ SOSY: 50.0000 ug | PREFILLED_SYRINGE | Freq: Once | INTRAMUSCULAR | Status: DC

## 2021-02-24 MED ORDER — DOBUTAMINE IN D5W 4-5 MG/ML-% IV SOLN
5.0000 ug/kg/min | INTRAVENOUS | Status: DC
Start: 2021-02-24 — End: 2021-02-25

## 2021-02-24 MED ORDER — SODIUM BICARBONATE 8.4 % IV SOLN
INTRAVENOUS | Status: AC | PRN
  Administered 2021-02-24 (×2): 50 meq via INTRAVENOUS

## 2021-02-24 MED ORDER — MILRINONE LACTATE IN DEXTROSE 20-5 MG/100ML-% IV SOLN
0.2500 ug/kg/min | INTRAVENOUS | Status: DC
  Administered 2021-02-24: 0.25 ug/kg/min via INTRAVENOUS
  Filled 2021-02-24: qty 100

## 2021-02-24 MED ORDER — TICAGRELOR 90 MG PO TABS
ORAL_TABLET | ORAL | Status: DC | PRN
  Administered 2021-02-24: 180 mg via ORAL

## 2021-02-24 MED ORDER — FENTANYL 2500MCG IN NS 250ML (10MCG/ML) PREMIX INFUSION
50.0000 ug/h | INTRAVENOUS | Status: DC
  Administered 2021-02-24 (×2): 50 ug/h via INTRAVENOUS
  Filled 2021-02-24: qty 250

## 2021-02-24 MED ORDER — AMIODARONE HCL IN DEXTROSE 360-4.14 MG/200ML-% IV SOLN: 30.0000 mg/h | INTRAVENOUS | Status: DC

## 2021-02-24 MED ORDER — EPINEPHRINE HCL 5 MG/250ML IV SOLN IN NS
INTRAVENOUS | Status: AC
  Filled 2021-02-24: qty 250

## 2021-02-24 MED ORDER — LIDOCAINE HCL 1 % IJ SOLN
INTRAMUSCULAR | Status: AC
  Filled 2021-02-24: qty 20

## 2021-02-24 MED ORDER — EPINEPHRINE HCL 5 MG/250ML IV SOLN IN NS
0.5000 ug/min | INTRAVENOUS | Status: DC
  Administered 2021-02-24: 15 ug/min via INTRAVENOUS
  Administered 2021-02-24: 14 ug/min via INTRAVENOUS
  Administered 2021-02-24: 1 ug/min via INTRAVENOUS
  Administered 2021-02-25: 7 ug/min via INTRAVENOUS
  Filled 2021-02-24 (×3): qty 250

## 2021-02-24 MED ORDER — CHLORHEXIDINE GLUCONATE CLOTH 2 % EX PADS: 6.0000 | MEDICATED_PAD | Freq: Every day | CUTANEOUS | Status: DC

## 2021-02-24 MED ORDER — MIDAZOLAM HCL 2 MG/2ML IJ SOLN
INTRAMUSCULAR | Status: AC
  Filled 2021-02-24: qty 2

## 2021-02-24 MED ORDER — CANGRELOR BOLUS VIA INFUSION
INTRAVENOUS | Status: DC | PRN
  Administered 2021-02-24: 2100 ug via INTRAVENOUS

## 2021-02-24 MED ORDER — ASPIRIN 81 MG PO CHEW
81.0000 mg | CHEWABLE_TABLET | Freq: Every day | ORAL | Status: DC
  Administered 2021-02-25: 81 mg
  Filled 2021-02-24: qty 1

## 2021-02-24 MED ORDER — VASOPRESSIN 20 UNITS/100 ML INFUSION FOR SHOCK
0.0000 [IU]/min | INTRAVENOUS | Status: DC
  Administered 2021-02-24: 0.03 [IU]/min via INTRAVENOUS
  Filled 2021-02-24 (×2): qty 100

## 2021-02-24 MED ORDER — PROPOFOL 1000 MG/100ML IV EMUL
0.0000 ug/kg/min | INTRAVENOUS | Status: DC
  Administered 2021-02-24: 5 ug/kg/min via INTRAVENOUS
  Filled 2021-02-24: qty 100

## 2021-02-24 MED ORDER — SODIUM CHLORIDE 0.9 % IV SOLN: INTRAVENOUS | Status: DC | PRN

## 2021-02-24 MED ORDER — HEPARIN (PORCINE) IN NACL 1000-0.9 UT/500ML-% IV SOLN
INTRAVENOUS | Status: AC
  Filled 2021-02-24: qty 1000

## 2021-02-24 MED ORDER — FENTANYL CITRATE (PF) 100 MCG/2ML IJ SOLN
INTRAMUSCULAR | Status: DC | PRN
  Administered 2021-02-24 (×2): 50 ug via INTRAVENOUS

## 2021-02-24 MED ORDER — EPINEPHRINE 1 MG/10ML IJ SOSY
PREFILLED_SYRINGE | INTRAMUSCULAR | Status: AC | PRN
  Administered 2021-02-24: .1 mg via INTRAVENOUS

## 2021-02-24 MED ORDER — ORAL CARE MOUTH RINSE
15.0000 mL | OROMUCOSAL | Status: DC
  Administered 2021-02-24 – 2021-02-25 (×7): 15 mL via OROMUCOSAL

## 2021-02-24 MED ORDER — DOBUTAMINE IN D5W 4-5 MG/ML-% IV SOLN
INTRAVENOUS | Status: AC
  Administered 2021-02-24: 1000 mg
  Filled 2021-02-24: qty 250

## 2021-02-24 MED ORDER — DOBUTAMINE NICU 2 MG/ML IV INFUSION =/>1.5 KG (25 ML) - SIMPLE MED
5.0000 ug/kg/min | INTRAVENOUS | Status: DC
  Filled 2021-02-24: qty 25

## 2021-02-24 MED ORDER — FENTANYL 2500MCG IN NS 250ML (10MCG/ML) PREMIX INFUSION
INTRAVENOUS | Status: AC | PRN
  Administered 2021-02-24: 50 ug/h via INTRAVENOUS

## 2021-02-24 MED ORDER — MIDAZOLAM HCL 2 MG/2ML IJ SOLN
INTRAMUSCULAR | Status: DC | PRN
  Administered 2021-02-24 (×3): 2 mg via INTRAVENOUS

## 2021-02-24 MED ORDER — NOREPINEPHRINE BITARTRATE 1 MG/ML IV SOLN
INTRAVENOUS | Status: DC | PRN
  Administered 2021-02-24: 10 ug/min via INTRAVENOUS

## 2021-02-24 MED ORDER — FENTANYL CITRATE (PF) 100 MCG/2ML IJ SOLN
INTRAMUSCULAR | Status: AC
  Filled 2021-02-24: qty 2

## 2021-02-24 MED ORDER — NITROGLYCERIN IN D5W 200-5 MCG/ML-% IV SOLN
0.0000 ug/min | INTRAVENOUS | Status: DC
  Filled 2021-02-24 (×2): qty 250

## 2021-02-24 MED ORDER — HEPARIN SODIUM (PORCINE) 1000 UNIT/ML IJ SOLN
INTRAMUSCULAR | Status: AC
  Filled 2021-02-24: qty 1

## 2021-02-24 MED ORDER — FUROSEMIDE 10 MG/ML IJ SOLN
INTRAMUSCULAR | Status: AC
  Filled 2021-02-24: qty 4

## 2021-02-24 MED ORDER — LABETALOL HCL 5 MG/ML IV SOLN
INTRAVENOUS | Status: DC | PRN
  Administered 2021-02-24 (×2): 10 mg via INTRAVENOUS

## 2021-02-24 MED ORDER — SODIUM CHLORIDE 0.9% FLUSH: 10.0000 mL | INTRAVENOUS | Status: DC | PRN

## 2021-02-24 MED ORDER — ACETAMINOPHEN 650 MG RE SUPP: 650.0000 mg | RECTAL | Status: DC

## 2021-02-24 MED ORDER — AMIODARONE HCL IN DEXTROSE 360-4.14 MG/200ML-% IV SOLN: 60.0000 mg/h | INTRAVENOUS | Status: DC

## 2021-02-24 MED ORDER — SODIUM CHLORIDE 0.9 % IV SOLN
INTRAVENOUS | Status: AC | PRN
  Administered 2021-02-24 (×2): 4 ug/kg/min via INTRAVENOUS

## 2021-02-24 MED ORDER — INSULIN ASPART 100 UNIT/ML IJ SOLN
2.0000 [IU] | INTRAMUSCULAR | Status: DC
Start: 2021-02-24 — End: 2021-02-24

## 2021-02-24 MED ORDER — NOREPINEPHRINE 4 MG/250ML-% IV SOLN: 0.0000 ug/min | INTRAVENOUS | Status: DC

## 2021-02-24 MED ORDER — CHLORHEXIDINE GLUCONATE 0.12% ORAL RINSE (MEDLINE KIT)
15.0000 mL | Freq: Two times a day (BID) | OROMUCOSAL | Status: DC
  Administered 2021-02-24 – 2021-02-25 (×3): 15 mL via OROMUCOSAL

## 2021-02-24 MED ORDER — EPINEPHRINE 1 MG/10ML IJ SOSY
PREFILLED_SYRINGE | INTRAMUSCULAR | Status: AC | PRN
  Administered 2021-02-24 (×2): .1 mg via INTRAVENOUS

## 2021-02-24 SURGICAL SUPPLY — 31 items
BALLN TREK RX 2.5X15 (BALLOONS) ×2
BALLOON TREK RX 2.5X15 (BALLOONS) ×1 IMPLANT
CANNULA 5F STIFF (CANNULA) ×2 IMPLANT
CASSETTE CONTROL PURGE IMPELLA (MISCELLANEOUS) IMPLANT
CATH INFINITI 5FR ANG PIGTAIL (CATHETERS) ×2 IMPLANT
CATH INFINITI JR4 5F (CATHETERS) ×2 IMPLANT
CATH SWAN GANZ VIP 7.5F (CATHETERS) ×2 IMPLANT
CATH VISTA GUIDE 6FR XB3.5 (CATHETERS) ×2 IMPLANT
COVER PROBE U/S 5X48 (MISCELLANEOUS) ×2 IMPLANT
DEVICE CLOSURE MYNXGRIP 6/7F (Vascular Products) ×2 IMPLANT
DRAPE BRACHIAL (DRAPES) ×2 IMPLANT
GUIDEWIRE EMER 3M J .025X150CM (WIRE) ×2 IMPLANT
GUIDEWIRE INQWIRE 1.5J.035X260 (WIRE) ×2 IMPLANT
IMMOB KNEE 14 THIGH 24 706614 (SOFTGOODS) ×2 IMPLANT
INQWIRE 1.5J .035X260CM (WIRE) ×4
KIT ENCORE 26 ADVANTAGE (KITS) ×2 IMPLANT
KIT SYRINGE INJ CVI SPIKEX1 (MISCELLANEOUS) ×2 IMPLANT
NEEDLE PERC 18GX7CM (NEEDLE) ×2 IMPLANT
PACK CARDIAC CATH (CUSTOM PROCEDURE TRAY) ×2 IMPLANT
PROTECTION STATION PRESSURIZED (MISCELLANEOUS) ×2
SET ATX SIMPLICITY (MISCELLANEOUS) ×2 IMPLANT
SET IMPELLA CP PUMP (CATHETERS) IMPLANT
SHEATH AVANTI 5FR X 11CM (SHEATH) ×2 IMPLANT
SHEATH AVANTI 6FR X 11CM (SHEATH) ×2 IMPLANT
SHEATH AVANTI 7FRX11 (SHEATH) ×2 IMPLANT
SHEATH BRITE TIP 8FRX11 (SHEATH) ×2 IMPLANT
STATION PROTECTION PRESSURIZED (MISCELLANEOUS) ×1 IMPLANT
STENT ONYX FRONTIER 2.75X18 (Permanent Stent) ×2 IMPLANT
TUBING CIL FLEX 10 FLL-RA (TUBING) ×2 IMPLANT
WIRE ASAHI PROWATER 180CM (WIRE) ×2 IMPLANT
WIRE G V18X300CM (WIRE) ×2 IMPLANT

## 2021-02-24 NOTE — Progress Notes (Signed)
   Called by staff nurse.   On admit hypertensive. Nitro drip started. Now hypotensive.   Nitro stopped . With ongoing ectopy will need to continue amio drip. Starting Norepi drip and continue on milrinone 0.25 mcg.    CVP 4---> Given 500 cc NS bolus.  CO-OX 60%   Flo Trak  SVR 3050  CO 1.6  CI 0.7   Check CO-OX and lactic acid now.    Discussed with Dr Shirlee Latch.   Sherald Balbuena NP-C  12:28 PM

## 2021-02-24 NOTE — Progress Notes (Signed)
PCCM INTERVAL PROGRESS NOTE  Discussed results of CT head with radiology. Unfortunately there are already signs of hypoxic injury related to the patient's cardiac arrest. I have discussed this with his family and how it portends to his overall prognosis. At this time they wish to continue supportive measures, but endorse DNR should he arrest.    Joneen Roach, AGACNP-BC [facility]  See Amion for personal pager PCCM on call pager [phone] until 7pm. Please call Elink 7p-7a. [phone]  02/18/2021 5:02 PM

## 2021-02-24 NOTE — ED Notes (Addendum)
Patient taken to Cath lab with full monitoring/ zoll with RT, this RN, Rosalie Doctor RN, and Aundra Millet, Charity fundraiser. Report given to cath lab at bedside.

## 2021-02-24 NOTE — Progress Notes (Signed)
eLink Physician-Brief Progress Note Patient Name: Tyler Morales DOB: [birth year] MRN: [id number]   Date of Service  March 20, 2021  HPI/Events of Note  Patient meets criteria for transition from Insulin gtt to sq Insulin.  eICU Interventions  Insulin transition orders entered.        Thomasene Lot Noelie Renfrow 03/20/21, 11:34 PM

## 2021-02-24 NOTE — Plan of Care (Signed)

## 2021-02-24 NOTE — H&P (Addendum)
Advanced Heart Failure Team History and Physical Note   PCP:  Perrin Maltese, MD  PCP-Cardiology: None     Reason for Admission: Post Arrest/ STEMI   HPI:   Mr Mazzoli is a 55 year old a history of CAD, DES RCA, HTN, and DMII.   Had [place] 2020 with moderate disease in LAD and no significant restenosis in RCA with moderate disease distally and PDA and normal left ventricular systolic function.   OOH witnessed arrest. EMS shock x1 and given 5 rounds Epi. In the ED had PEA with multiple  rounds of EPI with CPR ~ 15 minutes.  EKG with ST elevation anterior leads. STEMI. Taken urgently to cath lab.  100% LAD with successful DES to mid LAD, 80% mild lateral OM, 50% RCA, EF 30-35%. RHC with PA sat 71%, RA 8 , PCWP 16, Fick 4.5 and CI 2.5.  Started on milrinone drip at 0.25 mcg.   Transferred to [facility] for management. On arrival he is intubated on milrinone 0.25 mcg. HTN on arrival. Impella sheath in R fem. Had NSVT on arrival.  L pec enlarged.   Echo POCUS- EF 35%  IVC not dilated.   Review of Systems: [y] = yes, [ ]  = no Patient is encephalopathic and or intubated. Therefore history has been obtained from chart review.    General: Weight gain [ ] ; Weight loss [ ] ; Anorexia [ ] ; Fatigue [ ] ; Fever [ ] ; Chills [ ] ; Weakness [ ]   Cardiac: Chest pain/pressure [ ] ; Resting SOB [ ] ; Exertional SOB [ ] ; Orthopnea [ ] ; Pedal Edema [ ] ; Palpitations [ ] ; Syncope [ ] ; Presyncope [ ] ; Paroxysmal nocturnal dyspnea[ ]   Pulmonary: Cough [ ] ; Wheezing[ ] ; Hemoptysis[ ] ; Sputum [ ] ; Snoring [ ]   GI: Vomiting[ ] ; Dysphagia[ ] ; Melena[ ] ; Hematochezia [ ] ; Heartburn[ ] ; Abdominal pain [ ] ; Constipation [ ] ; Diarrhea [ ] ; BRBPR [ ]   GU: Hematuria[ ] ; Dysuria [ ] ; Nocturia[ ]   Vascular: Pain in legs with walking [ ] ; Pain in feet with lying flat [ ] ; Non-healing sores [ ] ; Stroke [ ] ; TIA [ ] ; Slurred speech [ ] ;  Neuro: Headaches[ ] ; Vertigo[ ] ; Seizures[ ] ; Paresthesias[ ] ;Blurred vision [ ] ; Diplopia [ ] ;  Vision changes [ ]   Ortho/Skin: Arthritis [ ] ; Joint pain [ ] ; Muscle pain [ ] ; Joint swelling [ ] ; Back Pain [ ] ; Rash [ ]   Psych: Depression[ ] ; Anxiety[ ]   Heme: Bleeding problems [ ] ; Clotting disorders [ ] ; Anemia [ ]   Endocrine: Diabetes [ Y]; Thyroid dysfunction[ ]    Home Medications Prior to Admission medications   Medication Sig Start Date End Date Taking? Authorizing Provider  aspirin EC 81 MG EC tablet Take 1 tablet (81 mg total) by mouth daily. 09/25/15   Hower, Aaron Mose, MD  atorvastatin (LIPITOR) 80 MG tablet Take 1 tablet (80 mg total) by mouth daily at 6 PM. 09/25/15   Hower, Aaron Mose, MD  BRILINTA 60 MG TABS tablet Take 60 mg by mouth 2 (two) times daily. 08/06/20   [provider]  carvedilol (COREG) 25 MG tablet Take 25 mg by mouth 2 (two) times daily. 06/17/20   [provider]  enalapril (VASOTEC) 10 MG tablet Take 10 mg by mouth daily. 09/10/20   [provider]  FARXIGA 10 MG TABS tablet Take 10 mg by mouth daily. 09/04/20   [provider]  predniSONE (DELTASONE) 10 MG tablet Three tabs po day1; two tabs  po day2,3; 1 tab po day4,5 then stop 09/12/20   Loletha Grayer, MD  RYBELSUS 7 MG TABS Take 1 tablet by mouth every morning. 06/13/20   [provider]  Vitamin D, Ergocalciferol, (DRISDOL) 1.25 MG (50000 UNIT) CAPS capsule Take 1 capsule by mouth once a week. 09/06/20   [provider]    Past Medical History: Past Medical History:  Diagnosis Date   Cerebral aneurysm without rupture    Chronic kidney disease    Coronary artery disease    Diabetes mellitus without complication ([place])    Hyperlipidemia    Hypertension    Myocardial infarction [facility])     Past Surgical History: Past Surgical History:  Procedure Laterality Date   CARDIAC CATHETERIZATION Right 09/24/2015   Procedure: Left Heart Cath and Coronary Angiography;  Surgeon: Dionisio David, MD;  Location: [place] CV LAB;  Service: Cardiovascular;   Laterality: Right;   CARDIAC CATHETERIZATION N/A 09/24/2015   Procedure: Coronary Stent Intervention;  Surgeon: Yolonda Kida, MD;  Location: [place] CV LAB;  Service: Cardiovascular;  Laterality: N/A;   CORONARY ANGIOPLASTY     stent placement 6/17   LEFT HEART CATH AND CORONARY ANGIOGRAPHY Left 05/26/2018   Procedure: LEFT HEART CATH AND CORONARY ANGIOGRAPHY;  Surgeon: Dionisio David, MD;  Location: [place] CV LAB;  Service: Cardiovascular;  Laterality: Left;   SHOULDER SURGERY Left     Family History:  Family History  Problem Relation Age of Onset   Diabetes Mellitus II Other     Social History: Social History   Socioeconomic History   Marital status: Married    Spouse name: Not on file   Number of children: Not on file   Years of education: Not on file   Highest education level: Not on file  Occupational History   Not on file  Tobacco Use   Smoking status: Every Day    Packs/day: 0.50    Types: Cigarettes   Smokeless tobacco: Never  Vaping Use   Vaping Use: Never used  Substance and Sexual Activity   Alcohol use: Yes    Alcohol/week: 1.0 standard drink    Types: 1 Cans of beer per week    Comment: weekly,none last 24hrs   Drug use: Yes    Types: Marijuana    Comment: history of illicit drug use including cocaine, THC, benzos, and opiates   Sexual activity: Not on file  Other Topics Concern   Not on file  Social History Narrative   Not on file   Social Determinants of Health   Financial Resource Strain: Not on file  Food Insecurity: Not on file  Transportation Needs: Not on file  Physical Activity: Not on file  Stress: Not on file  Social Connections: Not on file    Allergies:  No Known Allergies  Objective:    Vital Signs:   Pulse Rate:  [79-101] 97 (11/07 0929) Resp:  [15-27] 20 (11/07 0929) BP: (58-164)/(38-117) 164/117 (11/07 0833) SpO2:  [100 %] 100 % (11/07 0930) FiO2 (%):  [60 %-100 %] 60 % (11/07 0930) Weight:  [77.1 kg]  77.1 kg (11/07 0930)   Filed Weights   02/18/2021 0930  Weight: 77.1 kg     Physical Exam     General:  Intubated /sedated . ZoLL Pads on.  HEENT: ETT Neck: Supple. no JVD. Carotids 2+ bilat; no bruits. No lymphadenopathy or thyromegaly appreciated. Cor: PMI nondisplaced. Regular rate & rhythm. No rubs, gallops or murmurs. Lungs:  Rhonci Abdomen: Soft, nontender, nondistended. No hepatosplenomegaly. No bruits or masses. Good bowel sounds. Extremities: Extremites Cold. No clubbing, rash, edema. R femoral impella sheath Neuro: Intubated/sedated  GU: Foley clear urine Telemetry   SR with NSVT/PVCs   EKG  SR with ST elevation V 2-4   Labs     Basic Metabolic Panel: Recent Labs  Lab 02/21/2021 0440  NA 139  K 4.6  CL 101  CO2 26  GLUCOSE 383*  BUN 22*  CREATININE 1.62*  CALCIUM 7.9*  MG 2.2    Liver Function Tests: No results for input(s): AST, ALT, ALKPHOS, BILITOT, PROT, ALBUMIN in the last 168 hours. No results for input(s): LIPASE, AMYLASE in the last 168 hours. No results for input(s): AMMONIA in the last 168 hours.  CBC: Recent Labs  Lab 03/07/2021 0440  WBC 14.5*  NEUTROABS 7.2  HGB 12.5*  HCT 37.7*  MCV 94.3  PLT 214    Cardiac Enzymes: No results for input(s): CKTOTAL, CKMB, CKMBINDEX, TROPONINI in the last 168 hours.  BNP: BNP (last 3 results) No results for input(s): BNP in the last 8760 hours.  ProBNP (last 3 results) No results for input(s): PROBNP in the last 8760 hours.   CBG: No results for input(s): GLUCAP in the last 168 hours.  Coagulation Studies: Recent Labs    02/21/2021 0440  LABPROT 16.4*  INR 1.3*    Imaging: CARDIAC CATHETERIZATION  Result Date: 03/01/2021   CULPRIT LESTION: Prox LAD to Mid LAD lesion is 100% stenosed.   A drug-eluting stent was successfully placed using a STENT ONYX FRONTIER [id number].   Post intervention, there is a 0% residual stenosis.   Dist LAD lesion is 50% stenosed.    -----------------------------------   Prox Cx to Mid Cx lesion is 40% stenosed.  Dist Cx lesion is 30% stenosed.  2nd Mrg lesion is 80% stenosed.   Mid RCA lesion is 30% stenosed. Dist RCA BMS is 20% stenosed. RPAV lesion is 50% stenosed.   There is moderate to severe left ventricular systolic dysfunction.  The left ventricular ejection fraction is 25-35% by visual estimate.-->  Mid to apical anterior wall hypokinesis.   LV end diastolic pressure is severely elevated.   Hemodynamic findings consistent with mild pulmonary hypertension. SUMMARY Cardiac Arrest Anterior ST elation MI with mid LAD occlusion 100% ---> Successful DES PCI of mid LAD with a Onyx Frontier 2.75 mm 18 mm 80% mid lateral OM, 50% proximal RCA (codominant) EF 30 to 35% with severely elevated LVEDP of 28 mmHg Right heart cath numbers: RA P 8 mmHg, RVP 34/7/8 mmHg, PAP 33/24/28 mmHg.  PCWP 18 mmHg.  LVP-EDP initially 175/112/136 mmHg once off pressors, follow-up 127/50/80 mmHg Cardiac Output-Index - Fick 4.46-2.48; TD 3.17-1.77 PLAN Convert from [place] to PO Brilinta _> D/c Cangrelor @ 940 AM [facility] - Dr. Shirlee LatchMcLean Milrinone infusion --anticipate right IJ Swan [facility]. Bryan Lemmaavid Harding, MD    Patient Profile  Mr Amie CritchleyBlackwell is a 55 year old a history of CAD, DES RCA, HTN, and DMII.   OOH witnessed arrest. Anterior STEMI. DES to LAD   Assessment/Plan   STEMI, anterior--> Acute Systolic HF.  - H/O DES to RCA.  -LHC today with 100% LAD---> DES LAD.  - On statin, aspirin , brillinta - POCUS EF 35%, IVC was not dilated.  - Continue milrinone 0.25 mcg and adding NTG drip.  - Formal ECHO ordered.  -Place central line/aline. Check CVP and CO-OX.  2. Cardiac Arrest-->PEA -Shockable rhythm received shock x1.  - Multiple rounds EPI with ROSC. ~15 minutes - On arrival with NSVT/PVCs-->Start amio drip  3. Acute Respiratory Failure -Urgent intubation at [facility] -CCM consulted. CXR now  4. HTN Emergency   -At [facility] given labetalol 20 mg with no improvement  -On arrival 180/120  -Started Nitroglycerin drip with aggressive titration.   5. L chest wall hematoma  -CCM - bedside US. Consistent with hematoma post CPR?   - Check CBC now.  - Place binder around chest to help compress.   6.  AKI  Creatinine 1-->1.6  Follow creatinine closely  -  7.  DMII Check Hgb A1C Start SSI   Tonye Becket, NP 02/28/2021, 9:43 AM  Advanced Heart Failure Team Pager [phone] (M-F; 7a - 5p)  Please contact [facility] for night-coverage after hours (4p -7a ) and weekends on [email or web address]  Patient seen with NP, agree with the above note.   Patient was admitted with cardiac arrest/presumed VF treated with defibrillation by EMS, found to have anterior STEMI.  Had PEA arrest with epinephrine given in the cath lab.    Cath with occluded mid LAD, treated with DES.  CI 2.5 (Fick) with elevated MAP, mechanical support not placed.  Bedside echo with EF 25-30%, LAD territory wall motion abnormalities.    Currently intubated, sedated.  Left pectoral hematoma noted at site of CPR.   General: NAD Neck: No JVD, no thyromegaly or thyroid nodule.  Lungs: Clear to auscultation bilaterally with normal respiratory effort. CV: Nondisplaced PMI.  Heart regular S1/S2, no S3/S4, no murmur.  No peripheral edema.  No carotid bruit.  Normal pedal pulses.  Abdomen: Soft, nontender, no hepatosplenomegaly, no distention.  Skin: Intact without lesions or rashes.  Neurologic: Sedated on vent.  Extremities: No clubbing or cyanosis.  HEENT: Normal.  MSK: Swollen left pectoral muscle area.   1. CAD: History of CAD with RCA PCI.  Admitted with anterior STEMI, now s/p DES to mLAD.  Has residual 80% PLOM stenosis.   - Continue ASA 81 and ticagrelor 90 bid.  - Atorvastatin 80 daily.  2. Acute systolic CHF: Ischemic cardiomyopathy.  Echo at bedside showed EF 25-30% range with LAD wall motion abnormalities, RV function preserved, no  obvious mechanical complication of MI.  He does not appear volume overloaded on exam, cardiac index by Fick was 2.5, 1.7 by thermo.  MAP elevated with cool extremities.  - Continue milrinone 0.25 for now.  - Start NTG gtt and can titrate as high as 200 mcg/min, goal BP today will be < 150/100 significant elevation).  - Place CVL to follow co-ox and CVP.  - Arterial line to follow MAP.  - Gradual initiation of GDMT.  - Formal echo.  3. AKI: Creatinine 1.6 in setting of CHF/STEMI.  Hopefully will improve with time.  4. Acute hypoxemic respiratory failure: Intubated in setting of cardiac arrest, hope to extubate soon. - CCM following.  - CXR.  5. Cardiac arrest: Initially shockable rhythm, likely VF.   Had PEA arrest in hospital, treated with epinephrine.  Has had NSVT runs.  - Amiodarone gtt.  6. Type 2 DM:  - Cover with insuling - hgbA1c - Eventual SGLT2 inhibitor.  7. Left chest wall hematoma: From CPR most likely.  - CBC.  - Use binder.   Marca Ancona 03/08/2021 11:21 AM

## 2021-02-24 NOTE — Procedures (Signed)
Central Venous Catheter Insertion Procedure Note  Tyler Morales  [id number]  01-Apr-1966  Date:March 18, 2021  Time:2:47 PM   Provider Performing:Travon Crochet   Procedure: Insertion of Non-tunneled Central Venous Catheter(36556) with US guidance (59741)   Indication(s) Medication administration and Difficult access  Consent Unable to obtain consent due to emergent nature of procedure.  Anesthesia On continuous sedation  Timeout Verified patient identification, verified procedure, site/side was marked, verified correct patient position, special equipment/implants available, medications/allergies/relevant history reviewed, required imaging and test results available.  Sterile Technique Maximal sterile technique including full sterile barrier drape, hand hygiene, sterile gown, sterile gloves, mask, hair covering, sterile ultrasound probe cover (if used).  Procedure Description Area of catheter insertion was cleaned with chlorhexidine and draped in sterile fashion.  With real-time ultrasound guidance a 7 French 20 cm central venous catheter was placed into the left internal jugular vein. Nonpulsatile blood flow and easy flushing noted in all ports.  The catheter was sutured in place and sterile dressing applied.  Complications/Tolerance None; patient tolerated the procedure well. Chest X-ray is ordered to verify placement for internal jugular or subclavian cannulation.   Chest x-ray is not ordered for femoral cannulation.  EBL Minimal  Specimen(s) None  Lynnell Catalan, MD [facility] ICU [facility] [place] [facility]  Pager: [phone] Or Epic Secure Chat After hours: [phone].  2021-03-18, 2:48 PM

## 2021-02-24 NOTE — Progress Notes (Addendum)
Made an additional check-in with family now seated in cath lab waiting area. They are doing well just awaiting full debrief from cardiologist , [phone]  02/26/2021 0640  Clinical Encounter Type  Visited With Health care provider;Family  Chaplain Burris checked-in with cath lab staff to communicate family presence in ED family consult room awaiting updates. Continuing to offer family support.

## 2021-02-24 NOTE — Progress Notes (Signed)
   03/10/2021 0336  Clinical Encounter Type  Visited With Patient not available  Visit Type Code  Chaplain Burris responded to code STEMI. Checked-in with house sup and will look for family per EMS.

## 2021-02-24 NOTE — Progress Notes (Signed)
Per Dr Charolotte Eke, we can warm the patient up to [facility].

## 2021-02-24 NOTE — Progress Notes (Addendum)
EEG- results pending.   Now on norepi 40 mcg + epi 20 mcg + vasopressin 0.03 units + milrinone 0.25 mcg   Remains hypotensive.   Stop milrinone and start dobutamine 5 mcg now.   Dr Shirlee Latch at that bedside.   Amy Clegg NP-C  3:01 PM  Patient seen with NP, agree with the above note.    Labile blood pressure throughout the day, now hypotensive and off NTG.  On high doses of NE, epinephrine, vasopressin.  Can titrate up to NE 60 and will stop milrinone/start dobutamine 5.  Lactate elevated at 2.6, co-ox lower early in the afternoon at 54%.    On exam, pupils are dilated and non-reactive.  He does not respond to pain.  EEG with diffuse slowing concerning for hypoxic encephalopathy.  CT head also concerning for hypoxemic injury.  Cocaine positive.   Discussed with family and CCM, will continue current management with pressors but will not proceed with mechanical support.  He will be DNR.  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   CRITICAL CARE Performed by: Marca Ancona  Total critical care time: 30 minutes  Critical care time was exclusive of separately billable procedures and treating other patients.  Critical care was necessary to treat or prevent imminent or life-threatening deterioration.  Critical care was time spent personally by me on the following activities: development of treatment plan with patient and/or surrogate as well as nursing, discussions with consultants, evaluation of patient's response to treatment, examination of patient, obtaining history from patient or surrogate, ordering and performing treatments and interventions, ordering and review of laboratory studies, ordering and review of radiographic studies, pulse oximetry and re-evaluation of patient's condition.  Marca Ancona 03/09/2021 5:49 PM

## 2021-02-24 NOTE — Code Documentation (Signed)
No pulse.  CPR started

## 2021-02-24 NOTE — Progress Notes (Signed)
Patient transported to CT and back without complications. RN at bedside.  

## 2021-02-24 NOTE — Progress Notes (Signed)
LTM started . No skin breakdown noted. Results pending.

## 2021-02-24 NOTE — Progress Notes (Signed)
eLink Physician-Brief Progress Note Patient Name: Tyler Morales DOB: 09-Sep-1965 MRN: [id number]   Date of Service  03/16/21  HPI/Events of Note  Bedside RN had a question about whether to give Brilinta ordered by cardiology.  eICU Interventions  She was asked to check with cardiology who ordered the medication.        Tyler Morales 16-Mar-2021, 9:49 PM

## 2021-02-24 NOTE — Progress Notes (Signed)
EEG done at bedside. No skin breakdown noted. Impedance= good. Results pending.

## 2021-02-24 NOTE — Progress Notes (Signed)
Orthopedic Tech Progress Note Patient Details:  Tyler Morales 1966/01/16 [id number]  This morning at 0808 RN called requesting a STAT KNEE IMMOBILIZER for patient, stating she needed it before they could remove patient from stretcher. Dropped off and left a note stating I needed an order yet still nothing.    Ortho Devices Type of Ortho Device: Knee Immobilizer Ortho Device/Splint Interventions: Other (comment)   Post Interventions Patient Tolerated: Other (comment) Instructions Provided: Other (comment)  Donald Pore 02/23/2021, 11:42 AM

## 2021-02-24 NOTE — ED Triage Notes (Signed)
Patient to ED via [facility] from home for STEMI/Cardiac arrest. Patient found unresponsive by family at home when fire got to scene no pulse was found. Per EMS, they administered 2 shocks, 5 epi, and 1 Narcan. 13 rounds of CPR administered PTA with ROS obtained twice during that time. Patient came in CPR in progress.

## 2021-02-24 NOTE — ED Notes (Signed)
Patient belongings:  Tyler Morales colored head scarf Grey colored chain Grey colored ring   Sent in patient belongings bag to cath lab with patient

## 2021-02-24 NOTE — Progress Notes (Signed)
   March 16, 2021 0345  Clinical Encounter Type  Visited With Family;Patient not available  Visit Type Initial  Spiritual Encounters  Spiritual Needs Emotional;Grief support;Prayer  Chaplain Sophronia Simas greeted Pt's spouse in ED waiting, escorted to family consult room in ED. Also her mother. Chaplain provided compassionate, non-anxious presence and hospitality. Chaplain Burris provided active listening as spouse recounted events of the evening prior to EMS arrival. Event also reminds family of past heart attack which was just three days after her father passed (also with heart issues). Chaplain Burris helped to center family with deep breathing and also offered prayer.  Pt's brother in law also arrived for support. Pt still in cath lab with an initial update for family by Dr. Elesa Massed. Luna Fuse will continue to offer supportive presence with family as they wait.

## 2021-02-24 NOTE — Progress Notes (Signed)
60 mL versed and 200 mL fentanyl wasted in stericycle with Malva Limes RN as witness.

## 2021-02-24 NOTE — Code Documentation (Signed)
Pulese found rosc at this time

## 2021-02-24 NOTE — Consult Note (Addendum)
NAME:  Tyler Morales, MRN:  [id number], DOB:  [birth year], LOS: 0 ADMISSION DATE:  03/15/2021, CONSULTATION DATE:  11/7 REFERRING MD:  Dr. Gala Romney , CHIEF COMPLAINT:  Cardiac Arrest   History of Present Illness:  Patient is a 55 year old male with past medical history as below, which is significant for diabetes mellitus, hypertension, hyperlipidemia, coronary artery disease, CKD, and cerebral aneurysm without rupture.  He presented to the emergency department at [facility] on 11/7 as a cardiac arrest.  He was reportedly in his usual state of health until he woke his wife up in the middle of the night with complaints of vomiting and shortness of breath just prior to syncopal episode.  His wife was able to feel pulses and did not start CPR.  911 was called and upon arrival of the fire department he was pulseless.  CPR was initiated and 1 shock was administered by ED.  Upon EMS arrival he was again pulseless and CPR was started.  Rhythm was PEA.  He had intermittent ROSC which was nonsustained.  Upon arrival to the emergency department he remained in PEA and underwent additional 6 minutes of CPR.  There was concern for ST elevation on EKG and the STEMI team was activated with emergent transfer to cardiac Cath Lab.  100% stenosis of the proximal LAD to the mid LAD was found and treated with a drug-eluting stent.  Other areas of stenosis were noted but noncritical and no intervention was done.  LVEF was estimated at 25 to 35%.  Impella device was placed in the Cath Lab and the patient was transferred emergently to [facility].  Pertinent  Medical History   has a past medical history of Cerebral aneurysm without rupture, Chronic kidney disease, Coronary artery disease, Diabetes mellitus without complication (HCC), Hyperlipidemia, Hypertension, and Myocardial infarction (HCC).   Significant Hospital Events: Including procedures, antibiotic start and stop dates in addition to  other pertinent events   11/7 cardiac arrest 2/2 STEMI with 100% stenosis of LAD. DES placed. Impella.   Interim History / Subjective:    Objective   Pulse 97, resp. rate 20, height 5\' 6"  (1.676 m), weight 77.1 kg, SpO2 100 %.    Vent Mode: PRVC FiO2 (%):  [60 %-100 %] 60 % Set Rate:  [18 bmp-20 bmp] 20 bmp Vt Set:  [450 mL-510 mL] 510 mL PEEP:  [5 cmH20] 5 cmH20 Plateau Pressure:  [17 cmH20] 17 cmH20  No intake or output data in the 24 hours ending 03/11/2021 1042 Filed Weights   03/13/2021 0930  Weight: 77.1 kg    Examination: General: Middle aged male on vent HENT: [place]/AT, PERRL, no JVD Lungs: Clear bilateral mechanical breath sounds Cardiovascular: RRR, no MRG. L chest wall edema. Firm. No crepitus.  Abdomen: Soft, non- distended.  Extremities: No acute deformity Neuro: Coma/sedated.   Resolved Hospital Problem list     Assessment & Plan:   Cardiac arrest: out of hospital. Shocked once by AED. PEA on all other checks. Long duration for resuscitation estimated at least greater than 30 mins with intermittent brief ROSC.  - ICU hemodynamic monitoring - TTM protocol - PAD protocol with propofol/fentanyl for RASS goal -4 per TTM - Amiodarone infusion as still having som VT runs.  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   STEMI: s/p DES to 100% prox-mid LAD lesion. Additional lesions include distal LAD 50%, LCx 40%, 2nd marginal 80% - cardiology following - antiplatelet Brilinta  Cardiogenic shock HFrEF acute: LVEF 25-35% via visual estimation during LHC - Management per heart failure service - Echocardiogram pending.  - Milrinone infusion  Acute hypoxemic respiratory failure secondary to cardiac arrest.  - Full vent support - ABG, CXR pending - VAP bundle  Left pectoral hematoma: 2/2 CPR - place external chest binder - Check CBC  AKI: baseline creatinine 1 - hemodynamic support - Trend BMP  DM - CBG monitoring and SSI  Best Practice (right  click and "Reselect all SmartList Selections" daily)   Diet/type: NPO DVT prophylaxis: prophylactic heparin  GI prophylaxis: PPI Lines: Central line and Arterial Line Foley:  Yes, and it is still needed Code Status:  full code Last date of multidisciplinary goals of care discussion [ pending ]  Labs   CBC: Recent Labs  Lab 03/01/2021 0440  WBC 14.5*  NEUTROABS 7.2  HGB 12.5*  HCT 37.7*  MCV 94.3  PLT 214     Basic Metabolic Panel: Recent Labs  Lab 03/16/2021 0440  NA 139  K 4.6  CL 101  CO2 26  GLUCOSE 383*  BUN 22*  CREATININE 1.62*  CALCIUM 7.9*  MG 2.2    GFR: Estimated Creatinine Clearance: 50.9 mL/min (A) (by C-G formula based on SCr of 1.62 mg/dL (H)). Recent Labs  Lab 03/04/2021 0440  WBC 14.5*     Liver Function Tests: No results for input(s): AST, ALT, ALKPHOS, BILITOT, PROT, ALBUMIN in the last 168 hours. No results for input(s): LIPASE, AMYLASE in the last 168 hours. No results for input(s): AMMONIA in the last 168 hours.  ABG No results found for: PHART, PCO2ART, PO2ART, HCO3, TCO2, ACIDBASEDEF, O2SAT   Coagulation Profile: Recent Labs  Lab 02/22/2021 0440  INR 1.3*     Cardiac Enzymes: No results for input(s): CKTOTAL, CKMB, CKMBINDEX, TROPONINI in the last 168 hours.  HbA1C: Hgb A1c MFr Bld  Date/Time Value Ref Range Status  09/11/2020 10:00 AM 6.7 (H) 4.8 - 5.6 % Final    Comment:    (NOTE)         Prediabetes: 5.7 - 6.4         Diabetes: >6.4         Glycemic control for adults with diabetes: <7.0   05/25/2018 04:22 PM 13.5 (H) 4.8 - 5.6 % Final    Comment:    (NOTE) Pre diabetes:          5.7%-6.4% Diabetes:              >6.4% Glycemic control for   <7.0% adults with diabetes     CBG: No results for input(s): GLUCAP in the last 168 hours.  Review of Systems:   Patient is encephalopathic and/or intubated. Therefore history has been obtained from chart review.    Past Medical History:  He,  has a past medical  history of Cerebral aneurysm without rupture, Chronic kidney disease, Coronary artery disease, Diabetes mellitus without complication (HCC), Hyperlipidemia, Hypertension, and Myocardial infarction (HCC).   Surgical History:   Past Surgical History:  Procedure Laterality Date   CARDIAC CATHETERIZATION Right 09/24/2015   Procedure: Left Heart Cath and Coronary Angiography;  Surgeon: Laurier Nancy, MD;  Location: [facility] INVASIVE CV LAB;  Service: Cardiovascular;  Laterality: Right;   CARDIAC CATHETERIZATION N/A 09/24/2015   Procedure: Coronary Stent Intervention;  Surgeon: Alwyn Pea, MD;  Location: [facility] INVASIVE CV LAB;  Service: Cardiovascular;  Laterality: N/A;   CORONARY ANGIOPLASTY     stent placement 6/17  LEFT HEART CATH AND CORONARY ANGIOGRAPHY Left 05/26/2018   Procedure: LEFT HEART CATH AND CORONARY ANGIOGRAPHY;  Surgeon: Laurier Nancy, MD;  Location: [facility] INVASIVE CV LAB;  Service: Cardiovascular;  Laterality: Left;   SHOULDER SURGERY Left      Social History:   reports that he has been smoking cigarettes. He has been smoking an average of .5 packs per day. He has never used smokeless tobacco. He reports current alcohol use of about 1.0 standard drink per week. He reports current drug use. Drug: Marijuana.   Family History:  His family history includes Diabetes Mellitus II in an other family member.   Allergies No Known Allergies   Home Medications  Prior to Admission medications   Medication Sig Start Date End Date Taking? Authorizing Provider  aspirin EC 81 MG EC tablet Take 1 tablet (81 mg total) by mouth daily. 09/25/15   Hower, Cletis Athens, MD  atorvastatin (LIPITOR) 80 MG tablet Take 1 tablet (80 mg total) by mouth daily at 6 PM. 09/25/15   Hower, Cletis Athens, MD  BRILINTA 60 MG TABS tablet Take 60 mg by mouth 2 (two) times daily. 08/06/20   [provider]  carvedilol (COREG) 25 MG tablet Take 25 mg by mouth 2 (two) times daily. 06/17/20   [provider]   enalapril (VASOTEC) 10 MG tablet Take 10 mg by mouth daily. 09/10/20   [provider]  FARXIGA 10 MG TABS tablet Take 10 mg by mouth daily. 09/04/20   [provider]  predniSONE (DELTASONE) 10 MG tablet Three tabs po day1; two tabs po day2,3; 1 tab po day4,5 then stop 09/12/20   Alford Highland, MD  RYBELSUS 7 MG TABS Take 1 tablet by mouth every morning. 06/13/20   [provider]  Vitamin D, Ergocalciferol, (DRISDOL) 1.25 MG (50000 UNIT) CAPS capsule Take 1 capsule by mouth once a week. 09/06/20   [provider]     Critical care time: 50 minutes necessary due to cardiac arrest, respiration failure, cardiogenic shock      Joneen Roach, AGACNP-BC [facility]  See Amion for personal pager PCCM on call pager [phone] until 7pm. Please call Elink 7p-7a. [phone]  03/08/21 10:42 AM

## 2021-02-24 NOTE — Code Documentation (Signed)
LUCAS started at this time for compressions

## 2021-02-24 NOTE — Procedures (Signed)
Patient Name: Tyler Morales  MRN: [id number]  Epilepsy Attending: Charlsie Quest  Referring Physician/Provider: Joneen Roach, NP Date: 02/21/2021 Duration: 34.26 mins  Patient history: 55yo M s/p cardiac arrest. EEG to evaluate for seizure.   Level of alertness:  comatose  AEDs during EEG study: Propofol  Technical aspects: This EEG study was done with scalp electrodes positioned according to the 10-20 International system of electrode placement. Electrical activity was acquired at a sampling rate of 500Hz  and reviewed with a high frequency filter of 70Hz  and a low frequency filter of 1Hz . EEG data were recorded continuously and digitally stored.   Description: EEG showed continuous generalized background suppression. EEG was not reactive to tactile stimulation.  Hyperventilation and photic stimulation were not performed.     ABNORMALITY -Background suppression, generalized  IMPRESSION: This study is suggestive of profound diffuse encephalopathy, nonspecific etiology but could be secondary to sedation, anoxic/hypoxic brain injury. No seizures or epileptiform discharges were seen throughout the recording.   Ceirra Belli 

## 2021-02-24 NOTE — ED Notes (Addendum)
Covid swab and bloodwork sent to lab at this time.

## 2021-02-24 NOTE — Progress Notes (Signed)
  Echocardiogram 2D Echocardiogram with contrast has been performed.  Roosvelt Maser F 03/11/2021, 1:48 PM

## 2021-02-24 NOTE — Progress Notes (Signed)
Inpatient Diabetes Program Recommendations  AACE/ADA: New Consensus Statement on Inpatient Glycemic Control (2015)  Target Ranges:  Prepandial:   less than 140 mg/dL      Peak postprandial:   less than 180 mg/dL (1-2 hours)      Critically ill patients:  140 - 180 mg/dL   Lab Results  Component Value Date   GLUCAP 337 (H) 03-02-21   HGBA1C 6.7 (H) 09/11/2020    Review of Glycemic Control Results for TRES, [id number] (MRN [id number]) as of 03-02-2021 12:05  Ref. Range 03-02-21 11:38 2021/03/02 11:41  Glucose-Capillary Latest Ref Range: 70 - 99 mg/dL 837 (H) 290 (H)   Diabetes history: DM2 Outpatient Diabetes medications: Farxiga 10 mg qd Current orders for Inpatient glycemic control: IV insulin   Inpatient Diabetes Program Recommendations:   Agree with IV insulin and will follow during hospitalization.  Thank you, Tyler Morales. Ka Flammer, RN, MSN, CDE  Diabetes Coordinator Inpatient Glycemic Control Team Team Pager [phone] (8am-5pm) 03-02-2021 12:11 PM

## 2021-02-24 NOTE — H&P (Deleted)
NAME:  Tyler Morales, MRN:  [id number], DOB:  [birth year], LOS: 0 ADMISSION DATE:  03/17/2021, CONSULTATION DATE:  11/7 REFERRING MD:  Dr. Haroldine Laws , CHIEF COMPLAINT:  Cardiac Arrest   History of Present Illness:  Patient is a 55 year old male with past medical history as below, which is significant for diabetes mellitus, hypertension, hyperlipidemia, coronary artery disease, CKD, and cerebral aneurysm without rupture.  He presented to the emergency department at [facility] on 11/7 as a cardiac arrest.  He was reportedly in his usual state of health until he woke his wife up in the middle of the night with complaints of vomiting and shortness of breath just prior to syncopal episode.  His wife was able to feel pulses and did not start CPR.  911 was called and upon arrival of the fire department he was pulseless.  CPR was initiated and 1 shock was administered by ED.  Upon EMS arrival he was again pulseless and CPR was started.  Rhythm was PEA.  He had intermittent ROSC which was nonsustained.  Upon arrival to the emergency department he remained in PEA and underwent additional 6 minutes of CPR.  There was concern for ST elevation on EKG and the STEMI team was activated with emergent transfer to cardiac Cath Lab.  100% stenosis of the proximal LAD to the mid LAD was found and treated with a drug-eluting stent.  Other areas of stenosis were noted but noncritical and no intervention was done.  LVEF was estimated at 25 to 35%.  Impella device was placed in the Cath Lab and the patient was transferred emergently to [facility].  Pertinent  Medical History   has a past medical history of Cerebral aneurysm without rupture, Chronic kidney disease, Coronary artery disease, Diabetes mellitus without complication ([place]), Hyperlipidemia, Hypertension, and Myocardial infarction ([place]).   Significant Hospital Events: Including procedures, antibiotic start and stop dates in addition to  other pertinent events   11/7 cardiac arrest 2/2 STEMI with 100% stenosis of LAD. DES placed. Impella.   Interim History / Subjective:    Objective   Pulse 97, resp. rate 20, height 5\' 6"  (1.676 m), weight 77.1 kg, SpO2 100 %.    Vent Mode: PRVC FiO2 (%):  [60 %-100 %] 60 % Set Rate:  [18 bmp-20 bmp] 20 bmp Vt Set:  [450 mL-510 mL] 510 mL PEEP:  [5 cmH20] 5 cmH20 Plateau Pressure:  [17 cmH20] 17 cmH20  No intake or output data in the 24 hours ending 03/04/2021 0951 Filed Weights   03/07/2021 0930  Weight: 77.1 kg    Examination: General: Middle aged male on vent HENT: /AT, PERRL, no JVD Lungs: Clear bilateral mechanical breath sounds Cardiovascular: RRR, no MRG Abdomen: Soft, non- distended.  Extremities: No acute deformity Neuro: Coma/sedated.   Resolved Hospital Problem list     Assessment & Plan:   Cardiac arrest: out of hospital. Shocked once by AED. PEA on all other checks. Long duration for resuscitation estimated at least greater than 30 mins with intermittent brief ROSC.  - ICU hemodynamic monitoring - TTM protocol - PAD protocol with propofol/fentanyl for RASS goal -4 per TTM - Amiodarone infusion as still having som VT runs.  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   STEMI: s/p DES to 100% prox-mid LAD lesion. Additional lesions include distal LAD 50%, LCx 40%, 2nd marginal 80% - cardiology following - antiplatelet Brilinta   Cardiogenic shock HFrEF acute: LVEF 25-35% via  visual estimation during LHC - Management per heart failure service - Echocardiogram pending.  - Milrinone infusion  Acute hypoxemic respiratory failure secondary to cardiac arrest.  - Full vent support - ABG, CXR pending - VAP bundle  Left pectoral hematoma: 2/2 CPR - place external chest binder - Check CBC  AKI: baseline creatinine 1 - hemodynamic support - Trend BMP  DM - CBG monitoring and SSI  Best Practice (right click and "Reselect all SmartList  Selections" daily)   Diet/type: NPO DVT prophylaxis: prophylactic heparin  GI prophylaxis: PPI Lines: Central line and Arterial Line Foley:  Yes, and it is still needed Code Status:  full code Last date of multidisciplinary goals of care discussion [ pending ]  Labs   CBC: Recent Labs  Lab 03/06/2021 0440  WBC 14.5*  NEUTROABS 7.2  HGB 12.5*  HCT 37.7*  MCV 94.3  PLT [id number]    Basic Metabolic Panel: Recent Labs  Lab 03/17/2021 0440  NA 139  K 4.6  CL 101  CO2 26  GLUCOSE 383*  BUN 22*  CREATININE 1.62*  CALCIUM 7.9*  MG 2.2   GFR: Estimated Creatinine Clearance: 50.9 mL/min (A) (by C-G formula based on SCr of 1.62 mg/dL (H)). Recent Labs  Lab 02/18/2021 0440  WBC 14.5*    Liver Function Tests: No results for input(s): AST, ALT, ALKPHOS, BILITOT, PROT, ALBUMIN in the last 168 hours. No results for input(s): LIPASE, AMYLASE in the last 168 hours. No results for input(s): AMMONIA in the last 168 hours.  ABG No results found for: PHART, PCO2ART, PO2ART, HCO3, TCO2, ACIDBASEDEF, O2SAT   Coagulation Profile: Recent Labs  Lab 03/11/2021 0440  INR 1.3*    Cardiac Enzymes: No results for input(s): CKTOTAL, CKMB, CKMBINDEX, TROPONINI in the last 168 hours.  HbA1C: Hgb A1c MFr Bld  Date/Time Value Ref Range Status  09/11/2020 10:00 AM 6.7 (H) 4.8 - 5.6 % Final    Comment:    (NOTE)         Prediabetes: 5.7 - 6.4         Diabetes: >6.4         Glycemic control for adults with diabetes: <7.0   05/25/2018 04:22 PM 13.5 (H) 4.8 - 5.6 % Final    Comment:    (NOTE) Pre diabetes:          5.7%-6.4% Diabetes:              >6.4% Glycemic control for   <7.0% adults with diabetes     CBG: No results for input(s): GLUCAP in the last 168 hours.  Review of Systems:   Patient is encephalopathic and/or intubated. Therefore history has been obtained from chart review.    Past Medical History:  He,  has a past medical history of Cerebral aneurysm without rupture,  Chronic kidney disease, Coronary artery disease, Diabetes mellitus without complication (Tyler Morales), Hyperlipidemia, Hypertension, and Myocardial infarction (Tyler Morales).   Surgical History:   Past Surgical History:  Procedure Laterality Date   CARDIAC CATHETERIZATION Right 09/24/2015   Procedure: Left Heart Cath and Coronary Angiography;  Surgeon: Dionisio David, MD;  Location: [place] CV LAB;  Service: Cardiovascular;  Laterality: Right;   CARDIAC CATHETERIZATION N/A 09/24/2015   Procedure: Coronary Stent Intervention;  Surgeon: Yolonda Kida, MD;  Location: [place] CV LAB;  Service: Cardiovascular;  Laterality: N/A;   CORONARY ANGIOPLASTY     stent placement 6/17   LEFT HEART CATH AND CORONARY ANGIOGRAPHY Left 05/26/2018  Procedure: LEFT HEART CATH AND CORONARY ANGIOGRAPHY;  Surgeon: Laurier Nancy, MD;  Location: [facility] INVASIVE CV LAB;  Service: Cardiovascular;  Laterality: Left;   SHOULDER SURGERY Left      Social History:   reports that he has been smoking cigarettes. He has been smoking an average of .5 packs per day. He has never used smokeless tobacco. He reports current alcohol use of about 1.0 standard drink per week. He reports current drug use. Drug: Marijuana.   Family History:  His family history includes Diabetes Mellitus II in an other family member.   Allergies No Known Allergies   Home Medications  Prior to Admission medications   Medication Sig Start Date End Date Taking? Authorizing Provider  aspirin EC 81 MG EC tablet Take 1 tablet (81 mg total) by mouth daily. 09/25/15   Hower, Cletis Athens, MD  atorvastatin (LIPITOR) 80 MG tablet Take 1 tablet (80 mg total) by mouth daily at 6 PM. 09/25/15   Hower, Cletis Athens, MD  BRILINTA 60 MG TABS tablet Take 60 mg by mouth 2 (two) times daily. 08/06/20   [provider]  carvedilol (COREG) 25 MG tablet Take 25 mg by mouth 2 (two) times daily. 06/17/20   [provider]  enalapril (VASOTEC) 10 MG tablet Take 10 mg by  mouth daily. 09/10/20   [provider]  FARXIGA 10 MG TABS tablet Take 10 mg by mouth daily. 09/04/20   [provider]  predniSONE (DELTASONE) 10 MG tablet Three tabs po day1; two tabs po day2,3; 1 tab po day4,5 then stop 09/12/20   Alford Highland, MD  RYBELSUS 7 MG TABS Take 1 tablet by mouth every morning. 06/13/20   [provider]  Vitamin D, Ergocalciferol, (DRISDOL) 1.25 MG (50000 UNIT) CAPS capsule Take 1 capsule by mouth once a week. 09/06/20   [provider]     Critical care time: 50 minutes necessary due to cardiac arrest, respiration failure, cardiogenic shock      Joneen Roach, AGACNP-BC [facility]  See Amion for personal pager PCCM on call pager [phone] until 7pm. Please call Elink 7p-7a. [phone]  03/09/2021 10:32 AM

## 2021-02-24 NOTE — Progress Notes (Signed)
Date and time results received: March 10, 2021 1314  Test: lactic acid Critical Value: 2.6  Name of Provider Notified: Joneen Roach NP  Orders Received? Or Actions Taken?: none

## 2021-02-24 NOTE — Procedures (Signed)
Arterial Catheter Insertion Procedure Note  Tyler Morales  [id number]  Apr 08, 1966  Date:Mar 17, 2021  Time:2:46 PM    Provider Performing: Lynnell Catalan    Procedure: Insertion of Arterial Line (38250) with US guidance (53976)   Indication(s) Blood pressure monitoring and/or need for frequent ABGs  Consent Unable to obtain consent due to emergent nature of procedure.  Anesthesia None   Time Out Verified patient identification, verified procedure, site/side was marked, verified correct patient position, special equipment/implants available, medications/allergies/relevant history reviewed, required imaging and test results available.   Sterile Technique Maximal sterile technique including full sterile barrier drape, hand hygiene, sterile gown, sterile gloves, mask, hair covering, sterile ultrasound probe cover (if used).   Procedure Description Area of catheter insertion was cleaned with chlorhexidine and draped in sterile fashion. With real-time ultrasound guidance an arterial catheter was placed into the left radial artery.  Appropriate arterial tracings confirmed on monitor.     Complications/Tolerance None; patient tolerated the procedure well.   EBL Minimal   Specimen(s) None  Lynnell Catalan, MD [facility] ICU Physician [facility]  Pager: [phone] Or Epic Secure Chat After hours: [phone].  03-17-21, 2:47 PM

## 2021-02-24 NOTE — Progress Notes (Signed)
Spoke with Lynnell Catalan MD in regards to EEG findings. Orders received to d/c continuous EEG.

## 2021-02-24 NOTE — H&P (Signed)
Interventional Cardiology H&P   Patient ID: Tyler Morales MRN: [id number]; DOB: Jan 16, 1966  Admit date: March 12, 2021 Date of Consult: 12-Mar-2021  PCP:  Margaretann Loveless, MD   [facility] Providers Cardiologist:  Dr. Welton Flakes  Patient Profile:   Tyler Morales is a 55 y.o. male with a hx of distal RCA occlusion treated with BMS 2.75 mm 18 mm vision stent.  Who is being seen 2021-03-12 for the evaluation of Anterior ST elevation MI-cardiac arrest/CPR in progress at the request of Rochele Raring, MD.  History of Present Illness:   Tyler Morales apparently was in his usual state of health with family.  He had a witnessed syncopal event.  EMS was called roughly 5 minutes out (fire).  Upon arrival he had a shockable rhythm and did receive 1 shock.  Restored ROSC however then had PEA of several occasions.  Has had total of 5 of epinephrine in the outpatient setting.  Upon arrival to the ER he did have a pulse but then lost again to PEA received another total of 4 of epinephrine with CPR continuous throughout.  He had a ROSC with palpable pulse after the fourth epinephrine.  He had a significant anterior stimulation on EKG there was shown to me by the ER physician.  Once blood pressure was stabilized on Neo-Synephrine/Levophed, he was brought to the Cath Lab emergently.   Past Medical History:  Diagnosis Date   Cerebral aneurysm without rupture    Chronic kidney disease    Coronary artery disease    Diabetes mellitus without complication (HCC)    Hyperlipidemia    Hypertension    Myocardial infarction [facility])     Past Surgical History:  Procedure Laterality Date   CARDIAC CATHETERIZATION Right 09/24/2015   Procedure: Left Heart Cath and Coronary Angiography;  Surgeon: Laurier Nancy, MD;  Location: [facility] INVASIVE CV LAB;  Service: Cardiovascular;  Laterality: Right;   CARDIAC CATHETERIZATION N/A 09/24/2015   Procedure: Coronary Stent Intervention;  Surgeon: Alwyn Pea, MD;  Location:  [facility] INVASIVE CV LAB;  Service: Cardiovascular;  Laterality: N/A;   CORONARY ANGIOPLASTY     stent placement 6/17   LEFT HEART CATH AND CORONARY ANGIOGRAPHY Left 05/26/2018   Procedure: LEFT HEART CATH AND CORONARY ANGIOGRAPHY;  Surgeon: Laurier Nancy, MD;  Location: [facility] INVASIVE CV LAB;  Service: Cardiovascular;  Laterality: Left;   SHOULDER SURGERY Left    -> In June 2017 BMS PCI to RCA - 2.75 mm 18 mm vision stent  Home Medications:  Prior to Admission medications   Medication Sig Start Date End Date Taking? Authorizing Provider  aspirin EC 81 MG EC tablet Take 1 tablet (81 mg total) by mouth daily. 09/25/15   Hower, Cletis Athens, MD  atorvastatin (LIPITOR) 80 MG tablet Take 1 tablet (80 mg total) by mouth daily at 6 PM. 09/25/15   Hower, Cletis Athens, MD  BRILINTA 60 MG TABS tablet Take 60 mg by mouth 2 (two) times daily. 08/06/20   [provider]  carvedilol (COREG) 25 MG tablet Take 25 mg by mouth 2 (two) times daily. 06/17/20   [provider]  enalapril (VASOTEC) 10 MG tablet Take 10 mg by mouth daily. 09/10/20   [provider]  FARXIGA 10 MG TABS tablet Take 10 mg by mouth daily. 09/04/20   [provider]  predniSONE (DELTASONE) 10 MG tablet Three tabs po day1; two tabs po day2,3; 1 tab po day4,5 then stop 09/12/20   Alford Highland, MD  RYBELSUS 7 MG TABS Take 1 tablet by mouth every morning. 06/13/20   [provider]  Vitamin D, Ergocalciferol, (DRISDOL) 1.25 MG (50000 UNIT) CAPS capsule Take 1 capsule by mouth once a week. 09/06/20   [provider]    Inpatient Medications: Scheduled Meds:  Continuous Infusions:  PRN Meds:   Allergies:   No Known Allergies  Social History:   Social History   Socioeconomic History   Marital status: Married    Spouse name: Not on file   Number of children: Not on file   Years of education: Not on file   Highest education level: Not on file  Occupational History   Not on file  Tobacco Use    Smoking status: Every Day    Packs/day: 0.50    Types: Cigarettes   Smokeless tobacco: Never  Vaping Use   Vaping Use: Never used  Substance and Sexual Activity   Alcohol use: Yes    Alcohol/week: 1.0 standard drink    Types: 1 Cans of beer per week    Comment: weekly,none last 24hrs   Drug use: Yes    Types: Marijuana    Comment: history of illicit drug use including cocaine, THC, benzos, and opiates   Sexual activity: Not on file  Other Topics Concern   Not on file  Social History Narrative   Not on file   Social Determinants of Health   Financial Resource Strain: Not on file  Food Insecurity: Not on file  Transportation Needs: Not on file  Physical Activity: Not on file  Stress: Not on file  Social Connections: Not on file  Intimate Partner Violence: Not on file    Family History:    Family History  Problem Relation Age of Onset   Diabetes Mellitus II Other      ROS:  Please see the history of present illness.  Unable to obtain All other ROS reviewed and negative.     Physical Exam/Data:   Vitals:   Mar 20, 2021 0353 Mar 20, 2021 0354 2021-03-20 0355 2021-03-20 0358  BP:  (!) 58/38 (!) 86/72 (!) 73/49  Pulse: (!) 101 79 81 84  Resp: (!) 27 15 20 19   SpO2: 100% 100% 100% 100%   No intake or output data in the 24 hours ending 20-Mar-2021 0412 Last 3 Weights 09/11/2020 05/26/2018 05/25/2018  Weight (lbs) 168 lb 180 lb 184 lb  Weight (kg) 76.204 kg 81.647 kg 83.462 kg     There is no height or weight on file to calculate BMI.  General:  Well nourished, well developed, -> CPR in progress during initial evaluation.  Intubated and sedated HEENT: normal; ETT in place Neck: no JVD Vascular: No carotid bruits; palpable distal pulses.  Dopplerable pulses and femoral. Cardiac: Tachycardic normal S1, S2; RRR; no obvious M/R/G Lungs: Intubated with coarse diffuse breath sounds Abd: soft, nontender, no hepatomegaly  Ext: no edema Musculoskeletal:  No deformities, BUE and BLE strength  normal and equal Skin: warm and dry  Neuro: Intubated and sedated  EKG:  The EKG was personally reviewed and demonstrates: Sinus rhythm rate 93.  Massive 15 mm ST elevations in V3 with 5 to 6 mm elevations in V2 V4 also elevations noted in II and III as well as aVF.  These are not new. Telemetry:  Telemetry was personally reviewed and demonstrates: PEA per ER physician upon my arrival.  Pulse restored after fourth round of epinephrine in the ER.  Relevant CV Studies: See PMH/PSH  Laboratory Data:  High Sensitivity Troponin:  No results for input(s): TROPONINIHS in the last 720 hours.   ChemistryNo results for input(s): NA, K, CL, CO2, GLUCOSE, BUN, CREATININE, CALCIUM, MG, GFRNONAA, GFRAA, ANIONGAP in the last 168 hours.  No results for input(s): PROT, ALBUMIN, AST, ALT, ALKPHOS, BILITOT in the last 168 hours. Lipids No results for input(s): CHOL, TRIG, HDL, LABVLDL, LDLCALC, CHOLHDL in the last 168 hours.  HematologyNo results for input(s): WBC, RBC, HGB, HCT, MCV, MCH, MCHC, RDW, PLT in the last 168 hours. Thyroid No results for input(s): TSH, FREET4 in the last 168 hours.  BNPNo results for input(s): BNP, PROBNP in the last 168 hours.  DDimer No results for input(s): DDIMER in the last 168 hours.   Radiology/Studies:  No results found.   Assessment and Plan:   Principal Problem:   Acute ST elevation myocardial infarction (STEMI) of anterior wall (HCC) Active Problems:   Cardiac arrest due to underlying cardiac condition (HCC)   Cardiogenic shock (HCC)   Type 2 diabetes mellitus with complication, with long-term current use of insulin (HCC)  Plans TIG patient to the Cath Lab for emergent PCI.  Was apparently in shock in the ER with blood pressures in the 70s after restoration of ROSC.  Initial plan is to place Impella pump and transferred to [facility] post PCI.  We will perform Right Heart Cath for hemodynamic measurement.  Currently on Levophed upon transfer, will titrate  according to blood pressures seen in Bed.   TIMI Risk Score for ST  Elevation MI:   The patient's TIMI risk score is 7, which indicates a 23.4% risk of all cause mortality at 30 days.   New York Heart Association (NYHA) Functional Class NYHA Class IV     For questions or updates, please contact [facility] Please consult [email or web address] for contact info under    Signed, Bryan Lemma, MD  03-26-2021 4:12 AM

## 2021-02-24 NOTE — ED Notes (Signed)
4000 units heparin bolus given

## 2021-02-24 NOTE — ED Provider Notes (Signed)
[facility] Emergency Department Provider Note  ____________________________________________   Event Date/Time   First MD Initiated Contact with Patient 03-Mar-2021 0404     (approximate)  I have reviewed the triage vital signs and the nursing notes.   HISTORY  Chief Complaint Code STEMI    HPI Tyler Morales is a 55 y.o. male with history of hypertension, diabetes who presents to the emergency department with EMS for cardiac arrest and STEMI.  History is provided by EMS and patient's wife.  I spoke with patient's wife when she arrived to the emergency department.  She states that patient woke her up tonight complaining of vomiting, diarrhea and difficulty breathing and stated that he was going to go to the ER.  She states he got up and then passed out.  He was unresponsive.  She called her mother who is a retired Engineer, civil (consulting).  Her mother reports that she was able to find a radial pulse and that he had agonal respirations.  Family did not start CPR.  Fire department arrived and patient was pulseless.  CPR was initiated by fire department and patient had a shockable rhythm and received 1 defibrillation.  Wife reports first responders are only 1-2 blocks from her house.  On EMS arrival, patient was in PEA.  EMS reports that they had ROSC on 2 separate occasions.  When he did have a pulse, he was in a sinus tachycardia and EKG showed diffuse ST elevation.  CODE STEMI was activated in route.  Blood sugar with EMS was 158.  A King airway was in place.  He had an IO in the right tibia.  EMS reports they gave a total of 5 epinephrines.  He also received Narcan.  On arrival to the emergency department, patient had lost pulses again and compressions were being performed..  Patient had around 6 minutes of CPR with 2 epinephrines, 2 amps of bicarb.  We obtained ROSC and patient was in a sinus rhythm.  Initial blood pressure was normal but then he became hypotensive and IV Levophed was  initiated.  King airway was exchanged for a 7.5 endotracheal tube.  Repeat EKG again demonstrated diffuse ST elevation.  Patient then again lost pulses and was in PEA.  Received another 2 rounds of epinephrine and then we obtained ROSC.  Patient continued to be hypotensive and IV Levophed was increased and an epi infusion was hung but not started.  Cath Lab was ready for patient, Dr. Herbie Baltimore at bedside.  Patient did receive a 4000 unit heparin bolus but had not yet received rectal aspirin prior to be taken emergently to the cardiac catheterization lab.  ICU team aware of patient.     Past Medical History:  Diagnosis Date   Cerebral aneurysm without rupture    Chronic kidney disease    Coronary artery disease    Diabetes mellitus without complication (HCC)    Hyperlipidemia    Hypertension    Myocardial infarction [facility])     Patient Active Problem List   Diagnosis Date Noted   Acute ST elevation myocardial infarction (STEMI) of anterior wall (HCC) Mar 03, 2021   Cardiac arrest due to underlying cardiac condition (HCC) 03/03/2021   Cardiogenic shock (HCC) 03/03/21   Preseptal cellulitis of left upper eyelid    Type 2 diabetes mellitus with hyperlipidemia (HCC)    Facial cellulitis 09/11/2020   Coronary artery disease    Essential hypertension    Diabetes mellitus without complication (HCC)    Nicotine  dependence    NSTEMI (non-ST elevated myocardial infarction) (HCC) 09/24/2015    Past Surgical History:  Procedure Laterality Date   CARDIAC CATHETERIZATION Right 09/24/2015   Procedure: Left Heart Cath and Coronary Angiography;  Surgeon: Laurier Nancy, MD;  Location: [facility] INVASIVE CV LAB;  Service: Cardiovascular;  Laterality: Right;   CARDIAC CATHETERIZATION N/A 09/24/2015   Procedure: Coronary Stent Intervention;  Surgeon: Alwyn Pea, MD;  Location: [facility] INVASIVE CV LAB;  Service: Cardiovascular;  Laterality: N/A;   CORONARY ANGIOPLASTY     stent placement 6/17   LEFT HEART  CATH AND CORONARY ANGIOGRAPHY Left 05/26/2018   Procedure: LEFT HEART CATH AND CORONARY ANGIOGRAPHY;  Surgeon: Laurier Nancy, MD;  Location: [facility] INVASIVE CV LAB;  Service: Cardiovascular;  Laterality: Left;   SHOULDER SURGERY Left     Prior to Admission medications   Medication Sig Start Date End Date Taking? Authorizing Provider  aspirin EC 81 MG EC tablet Take 1 tablet (81 mg total) by mouth daily. 09/25/15   Hower, Cletis Athens, MD  atorvastatin (LIPITOR) 80 MG tablet Take 1 tablet (80 mg total) by mouth daily at 6 PM. 09/25/15   Hower, Cletis Athens, MD  BRILINTA 60 MG TABS tablet Take 60 mg by mouth 2 (two) times daily. 08/06/20   [provider]  carvedilol (COREG) 25 MG tablet Take 25 mg by mouth 2 (two) times daily. 06/17/20   [provider]  enalapril (VASOTEC) 10 MG tablet Take 10 mg by mouth daily. 09/10/20   [provider]  FARXIGA 10 MG TABS tablet Take 10 mg by mouth daily. 09/04/20   [provider]  predniSONE (DELTASONE) 10 MG tablet Three tabs po day1; two tabs po day2,3; 1 tab po day4,5 then stop 09/12/20   Alford Highland, MD  RYBELSUS 7 MG TABS Take 1 tablet by mouth every morning. 06/13/20   [provider]  Vitamin D, Ergocalciferol, (DRISDOL) 1.25 MG (50000 UNIT) CAPS capsule Take 1 capsule by mouth once a week. 09/06/20   [provider]    Allergies Patient has no known allergies.  Family History  Problem Relation Age of Onset   Diabetes Mellitus II Other     Social History Social History   Tobacco Use   Smoking status: Every Day    Packs/day: 0.50    Types: Cigarettes   Smokeless tobacco: Never  Vaping Use   Vaping Use: Never used  Substance Use Topics   Alcohol use: Yes    Alcohol/week: 1.0 standard drink    Types: 1 Cans of beer per week    Comment: weekly,none last 24hrs   Drug use: Yes    Types: Marijuana    Comment: history of illicit drug use including cocaine, THC, benzos, and opiates    Review of  Systems Level 5 caveat secondary to cardiac arrest  ____________________________________________   PHYSICAL EXAM:  VITAL SIGNS: ED Triage Vitals  Enc Vitals Group     BP 03/17/2021 0341 (!) 138/104     Pulse Rate 17-Mar-2021 0344 87     Resp 03-17-21 0341 15     Temp --      Temp src --      SpO2 Mar 17, 2021 0344 100 %     Weight --      Height --      Head Circumference --      Peak Flow --      Pain Score 17-Mar-2021 0344 0     Pain  Loc --      Pain Edu? --      Excl. in GC? --    CONSTITUTIONAL: Unresponsive, GCS 3 HEAD: Normocephalic EYES: Pupils fixed and dilated ENT: normal nose; King airway in place NECK: Supple, trachea midline CARD: Pulseless, in PEA, compressions being performed RESP: Equal bilateral breath sounds without rhonchi, wheezing or rales ABD/GI: Nondistended abdomen BACK: The back appears normal EXT: No edema, IO in the right tibia SKIN: Normal color for age and race; warm; no rash on exposed skin NEURO: GCS 3   ____________________________________________   LABS (all labs ordered are listed, but only abnormal results are displayed)  Labs Reviewed  CBC WITH DIFFERENTIAL/PLATELET - Abnormal; Notable for the following components:      Result Value   WBC 14.5 (*)    RBC 4.00 (*)    Hemoglobin 12.5 (*)    HCT 37.7 (*)    All other components within normal limits  RESP PANEL BY RT-PCR (FLU A&B, COVID) ARPGX2  BASIC METABOLIC PANEL  MAGNESIUM  PROTIME-INR  APTT  TROPONIN I (HIGH SENSITIVITY)   ____________________________________________  EKG   EKG Interpretation  Date/Time:  Monday February 24 2021 03:40:27 EST Ventricular Rate:  93 PR Interval:  177 QRS Duration: 101 QT Interval:  408 QTC Calculation: 508 R Axis:   96 Text Interpretation: Sinus rhythm Inferior infarct, acute Probable anterolateral infarct, acute Prolonged QT interval >>> Acute MI <<< Confirmed by Rochele Raring [phone]) on 03/01/2021 4:33:53 AM        ____________________________________________  RADIOLOGY Normajean Baxter Kimora Stankovic, personally viewed and evaluated these images (plain radiographs) as part of my medical decision making, as well as reviewing the written report by the radiologist.  ED MD interpretation:    Official radiology report(s): No results found.  ____________________________________________   PROCEDURES  Procedure(s) performed (including Critical Care):  Procedures  CRITICAL CARE Performed by: Rochele Raring   Total critical care time: 45 minutes  Critical care time was exclusive of separately billable procedures and treating other patients.  Critical care was necessary to treat or prevent imminent or life-threatening deterioration.  Critical care was time spent personally by me on the following activities: development of treatment plan with patient and/or surrogate as well as nursing, discussions with consultants, evaluation of patient's response to treatment, examination of patient, obtaining history from patient or surrogate, ordering and performing treatments and interventions, ordering and review of laboratory studies, ordering and review of radiographic studies, pulse oximetry and re-evaluation of patient's condition.   Cardiopulmonary Resuscitation (CPR) Procedure Note Directed/Performed by: Rochele Raring I personally directed ancillary staff and/or performed CPR in an effort to regain return of spontaneous circulation and to maintain cardiac, neuro and systemic perfusion.     INTUBATION Performed by: Rochele Raring  Required items: required blood products, implants, devices, and special equipment available Patient identity confirmed: provided demographic data and hospital-assigned identification number Time out: Immediately prior to procedure a "time out" was called to verify the correct patient, procedure, equipment, support staff and site/side marked as required.  Indications: Cardiac  arrest  Intubation method: Glidescope Laryngoscopy   Preoxygenation: Brooke Dare airway  Sedatives: none Paralytic: none  Tube Size: 7.5 cuffed, 25 cm at lip  Post-procedure assessment: chest rise and ETCO2 monitor Breath sounds: equal and absent over the epigastrium Tube secured with: ETT holder  Chest x-ray findings: pending  Patient tolerated the procedure well with no immediate complications.    ____________________________________________   INITIAL IMPRESSION / ASSESSMENT AND PLAN / ED  COURSE  As part of my medical decision making, I reviewed the following data within the electronic MEDICAL RECORD NUMBER History obtained from family, Nursing notes reviewed and incorporated, Labs reviewed , EKG interpreted , Old EKG reviewed, Old chart reviewed, A consult was requested and obtained from this/these consultant(s) Cardiology, and Notes from prior ED visits         Patient here for cardiac arrest with STEMI.  Dr. Herbie Baltimore with cardiology at bedside.  Appreciate his help.  Patient has had multiple episodes with loss of pulses requiring CPR, epinephrine, bicarb.  He has received a total of 9 rounds of epinephrine between EMS and in the ED and 2 A of bicarb.  Blood sugar was normal with EMS.  King airway was placed by EMS and replaced with an endotracheal tube.  Was hypotensive and IV Levophed started.  Received 4000 unit heparin bolus in the ED.  Patient had not yet received rectal aspirin prior to going to the Cath Lab.  Also has a chest x-ray pending after intubation.  Family has been updated.  ICU team aware of patient.  I reviewed all nursing notes and pertinent previous records as available.  I have reviewed and interpreted any EKGs, lab and urine results, imaging (as available).  ____________________________________________   FINAL CLINICAL IMPRESSION(S) / ED DIAGNOSES  Final diagnoses:  ST elevation myocardial infarction (STEMI), unspecified artery (HCC)  Cardiac arrest [facility])      ED Discharge Orders     None       *Please note:  AKASH WINSKI was evaluated in Emergency Department on Mar 05, 2021 for the symptoms described in the history of present illness. He was evaluated in the context of the global COVID-19 pandemic, which necessitated consideration that the patient might be at risk for infection with the SARS-CoV-2 virus that causes COVID-19. Institutional protocols and algorithms that pertain to the evaluation of patients at risk for COVID-19 are in a state of rapid change based on information released by regulatory bodies including the CDC and federal and state organizations. These policies and algorithms were followed during the patient's care in the ED.  Some ED evaluations and interventions may be delayed as a result of limited staffing during and the pandemic.*   Note:  This document was prepared using Dragon voice recognition software and may include unintentional dictation errors.    Mikelle Myrick, Layla Maw, DO 03/05/21 [phone]

## 2021-02-24 NOTE — Code Documentation (Signed)
Pulse check PEA; resume CPR 

## 2021-02-25 ENCOUNTER — Inpatient Hospital Stay (HOSPITAL_COMMUNITY): Payer: BC Managed Care – PPO

## 2021-02-25 DIAGNOSIS — R570 Cardiogenic shock: Secondary | ICD-10-CM | POA: Diagnosis not present

## 2021-02-25 DIAGNOSIS — J9602 Acute respiratory failure with hypercapnia: Secondary | ICD-10-CM | POA: Diagnosis not present

## 2021-02-25 DIAGNOSIS — I469 Cardiac arrest, cause unspecified: Secondary | ICD-10-CM | POA: Diagnosis not present

## 2021-02-25 DIAGNOSIS — J9690 Respiratory failure, unspecified, unspecified whether with hypoxia or hypercapnia: Secondary | ICD-10-CM | POA: Diagnosis present

## 2021-02-25 DIAGNOSIS — G931 Anoxic brain damage, not elsewhere classified: Secondary | ICD-10-CM

## 2021-02-25 DIAGNOSIS — J9601 Acute respiratory failure with hypoxia: Secondary | ICD-10-CM | POA: Diagnosis not present

## 2021-02-25 LAB — BASIC METABOLIC PANEL
Anion gap: 10 (ref 5–15)
BUN: 24 mg/dL — ABNORMAL HIGH (ref 6–20)
CO2: 22 mmol/L (ref 22–32)
Calcium: 7.9 mg/dL — ABNORMAL LOW (ref 8.9–10.3)
Chloride: 105 mmol/L (ref 98–111)
Creatinine, Ser: 1.39 mg/dL — ABNORMAL HIGH (ref 0.61–1.24)
GFR, Estimated: >60 mL/min (ref >60)
Glucose, Bld: 181 mg/dL — ABNORMAL HIGH (ref 70–99)
Potassium: 3.3 mmol/L — ABNORMAL LOW (ref 3.5–5.1)
Sodium: 137 mmol/L (ref 135–145)

## 2021-02-25 LAB — CBC
HCT: 34.1 % — ABNORMAL LOW (ref 39.0–52.0)
Hemoglobin: 12.1 g/dL — ABNORMAL LOW (ref 13.0–17.0)
MCH: 31.2 pg (ref 26.0–34.0)
MCHC: 35.5 g/dL (ref 30.0–36.0)
MCV: 87.9 fL (ref 80.0–100.0)
Platelets: 240 10*3/uL (ref 150–400)
RBC: 3.88 MIL/uL — ABNORMAL LOW (ref 4.22–5.81)
RDW: 14.2 % (ref 11.5–15.5)
WBC: 11.6 10*3/uL — ABNORMAL HIGH (ref 4.0–10.5)
nRBC: 0 % (ref 0.0–0.2)

## 2021-02-25 LAB — COOXEMETRY PANEL
Carboxyhemoglobin: 0.6 % (ref 0.5–1.5)
Carboxyhemoglobin: 0.6 % (ref 0.5–1.5)
Methemoglobin: 0.9 % (ref 0.0–1.5)
Methemoglobin: 0.9 % (ref 0.0–1.5)
O2 Saturation: 98.9 %
O2 Saturation: 67.1 %
Total hemoglobin: 11.7 g/dL — ABNORMAL LOW (ref 12.0–16.0)
Total hemoglobin: 11.7 g/dL — ABNORMAL LOW (ref 12.0–16.0)

## 2021-02-25 LAB — POCT ACTIVATED CLOTTING TIME
Activated Clotting Time: >1000 seconds
Activated Clotting Time: 126 seconds

## 2021-02-25 LAB — GLUCOSE, CAPILLARY
Glucose-Capillary: 156 mg/dL — ABNORMAL HIGH (ref 70–99)
Glucose-Capillary: 191 mg/dL — ABNORMAL HIGH (ref 70–99)
Glucose-Capillary: 179 mg/dL — ABNORMAL HIGH (ref 70–99)

## 2021-02-25 LAB — MAGNESIUM: Magnesium: 1.6 mg/dL — ABNORMAL LOW (ref 1.7–2.4)

## 2021-02-25 LAB — TRIGLYCERIDES: Triglycerides: 99 mg/dL (ref <150)

## 2021-02-25 LAB — PHOSPHORUS: Phosphorus: 3.7 mg/dL (ref 2.5–4.6)

## 2021-02-25 MED ORDER — TECHNETIUM TC 99M EXAMETAZIME IV KIT
21.0000 | PACK | Freq: Once | INTRAVENOUS | Status: AC | PRN
  Administered 2021-02-25: 21 via INTRAVENOUS

## 2021-02-25 MED ORDER — POTASSIUM CHLORIDE 20 MEQ PO PACK
40.0000 meq | PACK | Freq: Once | ORAL | Status: AC
  Administered 2021-02-25: 40 meq
  Filled 2021-02-25: qty 2

## 2021-02-25 MED ORDER — MAGNESIUM SULFATE 2 GM/50ML IV SOLN
2.0000 g | Freq: Once | INTRAVENOUS | Status: AC
  Administered 2021-02-25: 2 g via INTRAVENOUS
  Filled 2021-02-25: qty 50

## 2021-03-20 NOTE — Progress Notes (Signed)
  Progress Note   Date: 03/04/2021  Patient Name: Tyler Morales        MRN#: [id number]  Review the patient's clinical findings supports the diagnosis of:   Transaminitis

## 2021-03-20 NOTE — Significant Event (Signed)
Adult Brain Death Determination  Time of Examination: 03/12/21 3:20 PM  No Evidence of /Cause of Reversible CNS Depression  Core temperature must be greater >36 degrees. Last temp: Temp: (!) 96.4 F (35.8 C)  (Note: If unable to achieve normothermia after 12 hours of temperature management, may consider proceeding with Brain Death Evaluation.):    yes  Evidence of severe metabolic perturbations that could potentate CNS depression. Consider glucose, Na, creatinine, PaCO2, SaO2.:    Absent  Evidence of drugs, by history or measurement, that could potentiate central nervous system depression: narcotics, ethanol, benzodiazepines, barbiturates, neuromuscular blockade.:     Absent  Absence of Cortical Function  GCS = 3:    yes  Absence of Brain Stem Reflexes and Responses  Pupils light-fixed    yes  Corneal reflexes:    Absent  Response to upper and lower airway stimulation, such as pharyngeal and endotracheal suctioning.:    Absent  Ocular response to head turning (eye movement).    Absent  Absence of Spontaneous Respirations  (Apnea test performed per Brain Death Policy. If not met due to hemodynamic/ventilatory instability, then perform EEG, TCD, or cerebral blow flow studies.)  1.   Spontaneous Respirations   Absent  Due to potential for residual effects of narcotics as confounder proceeded to confirmatory testing.  Document Confirmatory Test Utilized: (Optional) Nuclear cerebral flow, cerebral angiography (CT/MR angio), transcranial Doppler ultrasound, EEG, SSEP (record results).  Test results (if available):  Nuclear cerebral perfusion consistent with brain death.   Patient pronounced dead by neurological criteria at 1:04PM on 03/12/21.  Kipp Brood, MD Mar 12, 2021 3:20 PM

## 2021-03-20 NOTE — Progress Notes (Signed)
   03/15/2021 1356  Clinical Encounter Type  Visited With Patient and family together  Visit Type Follow-up;Psychological support;Spiritual support;Death  Referral From Nurse  Consult/Referral To Chaplain   Chaplain Tery Sanfilippo responded to a page from the unit secretary to support the grieving family. Tyler Morales consulted with the attending nurse, Courtnay, and provided staff support throughout the family's visit. The patient's wife Tyler Morales), daughter Tyler Morales, three sisters, brother-in-law, niece, and five other family members. Tyler Morales provided grief support and liaison support between Tyler Morales, reps. Tyler Morales, and Tyler Morales. Chaplain Tyler Morales helped to facilitate a life review with the Mrs and his daughter. The patient's wife agreed to organ donation; however, his sisters objected. Tyler Morales facilitated storytelling to help de-escalate opposing family dynamics. The chaplain actively listened as the wife spoke of the patient's spiritual beliefs. Chaplain Tyler Morales accompanied Tyler Morales to the final consult with the family. Tyler Morales helped to facilitate end-of-life dynamics for extubation (respiratory Tyler Morales and explaining the Patient Placement card. Tyler Morales prayed with the family, read scripture, and was accompanied by a spiritual hymn per the request of the patient's sister. The family selected [facility] in [place], [place] [phone]. Mrs home [phone], mobile [phone]. This note was prepared by Tyler Morales, M.Div..  For questions please contact by phone [phone].

## 2021-03-20 NOTE — Progress Notes (Signed)
NAME:  Tyler Morales, MRN:  [id number], DOB:  [birth year], LOS: 1 ADMISSION DATE:  03/13/2021, CONSULTATION DATE:  11/7 REFERRING MD:  Dr. Haroldine Laws , CHIEF COMPLAINT:  Cardiac Arrest   History of Present Illness:  Patient is a 55 year old male with past medical history as below, which is significant for diabetes mellitus, hypertension, hyperlipidemia, coronary artery disease, CKD, and cerebral aneurysm without rupture.  He presented to the emergency department at [facility] on 11/7 as a cardiac arrest.  He was reportedly in his usual state of health until he woke his wife up in the middle of the night with complaints of vomiting and shortness of breath just prior to syncopal episode.  His wife was able to feel pulses and did not start CPR.  911 was called and upon arrival of the fire department he was pulseless.  CPR was initiated and 1 shock was administered by ED.  Upon EMS arrival he was again pulseless and CPR was started.  Rhythm was PEA.  He had intermittent ROSC which was nonsustained.  Upon arrival to the emergency department he remained in PEA and underwent additional 6 minutes of CPR.  There was concern for ST elevation on EKG and the STEMI team was activated with emergent transfer to cardiac Cath Lab.  100% stenosis of the proximal LAD to the mid LAD was found and treated with a drug-eluting stent.  Other areas of stenosis were noted but noncritical and no intervention was done.  LVEF was estimated at 25 to 35%.  Impella device was placed in the Cath Lab and the patient was transferred emergently to [facility].  Pertinent  Medical History   has a past medical history of Cerebral aneurysm without rupture, Chronic kidney disease, Coronary artery disease, Diabetes mellitus without complication ([place]), Hyperlipidemia, Hypertension, and Myocardial infarction ([place]).   Significant Hospital Events: Including procedures, antibiotic start and stop dates in addition to  other pertinent events   11/7 cardiac arrest 2/2 STEMI with 100% stenosis of LAD. DES placed. Impella.   Interim History / Subjective:  No acute events overnight. Remains on pressors, unresponsive on vent.   Objective   Blood pressure (!) 138/92, pulse 79, temperature (!) 97.2 F (36.2 C), resp. rate 20, height 5\' 6"  (1.676 m), weight 77.1 kg, SpO2 100 %. CVP:  [3 mmHg-7 mmHg] 7 mmHg  Vent Mode: PRVC FiO2 (%):  [40 %-60 %] 40 % Set Rate:  [20 bmp] 20 bmp Vt Set:  [510 mL] 510 mL PEEP:  [5 cmH20] 5 cmH20 Plateau Pressure:  [16 cmH20-18 cmH20] 17 cmH20   Intake/Output Summary (Last 24 hours) at 03-09-21 0802 Last data filed at 2021/03/09 0200 Gross per 24 hour  Intake 2816.72 ml  Output 1080 ml  Net 1736.72 ml   Filed Weights   03/02/2021 0930  Weight: 77.1 kg    Examination:  General: middle aged male on vent HENT: [place]/AT, EEG leads.  Lungs: Clear bilateral breath sounds Cardiovascular: RRR, no MRG. L pectoral edema improved.  Abdomen: Soft, non-distended. hypoactive Extremities: No acute deformity Neuro: Coma. No response to pain. Pupils 43mm and unreactive bilaterally. No spontaneous respirations on SBT.   Resolved Hospital Problem list     Assessment & Plan:   Cardiac arrest: out of hospital. Shocked once by AED. PEA on all other checks. Long duration for resuscitation estimated at least greater than 30 mins with intermittent brief ROSC. Cocaine positive on admission. CT scan on the day of arrest with  cerebral and cerebellar edema indicative of hypoxic injury. EEG with profound diffuse encephalopathy.  - ICU hemodynamic monitoring - TTM protocol, warm patient and minimize sedation in order to obtain neuro exam. Fentanyl infusion turned off at approximately 0900 this morning.   - Amiodarone infusion ongoing  STEMI: s/p DES to 100% prox-mid LAD lesion. Additional lesions include distal LAD 50%, LCx 40%, 2nd marginal 80% - cardiology following - antiplatelet Brilinta    Cardiogenic shock HFrEF acute: LVEF 25-35% via visual estimation during LHC, 30-35% by echo.  - Management per heart failure service - Dobutamine, NE, and epinephrine ongoing. Rates significantly improved from yesterday.   Acute hypoxemic respiratory failure secondary to cardiac arrest.  - Full vent support - ABG, CXR pending - VAP bundle  Left pectoral hematoma: 2/2 CPR - External chest binder - Improved - Check CBC  AKI: baseline creatinine 1 - hemodynamic support - Trend BMP  DM - CBG monitoring and SSI  Best Practice (right click and "Reselect all SmartList Selections" daily)   Diet/type: NPO DVT prophylaxis: prophylactic heparin  GI prophylaxis: PPI Lines: Central line and Arterial Line Foley:  Yes, and it is still needed Code Status:  full code Last date of multidisciplinary goals of care discussion [pending]  Labs   CBC: Recent Labs  Lab 03/11/2021 0440 02/18/2021 1121 03/15/2021 1132 2021-02-26 0533  WBC 14.5*  --  20.2* 11.6*  NEUTROABS 7.2  --   --   --   HGB 12.5* 13.9 13.8 12.1*  HCT 37.7* 41.0 39.3 34.1*  MCV 94.3  --  88.7 87.9  PLT 214  --  305 240     Basic Metabolic Panel: Recent Labs  Lab 03/13/2021 0440 02/28/2021 1121 03/12/2021 1132 02/26/21 0533  NA 139 137 135 137  K 4.6 4.1 4.2 3.3*  CL 101  --  103 105  CO2 26  --  19* 22  GLUCOSE 383*  --  368* 181*  BUN 22*  --  29* 24*  CREATININE 1.62*  --  1.66* 1.39*  CALCIUM 7.9*  --  7.7* 7.9*  MG 2.2  --   --   --     GFR: Estimated Creatinine Clearance: 59.4 mL/min (A) (by C-G formula based on SCr of 1.39 mg/dL (H)). Recent Labs  Lab 03/13/2021 0440 03/08/2021 1132 03/07/2021 1222 02/22/2021 1726 02-26-2021 0533  WBC 14.5* 20.2*  --   --  11.6*  LATICACIDVEN  --   --  2.6* 3.3*  --      Liver Function Tests: Recent Labs  Lab 03/15/2021 1132  AST 414*  ALT 177*  ALKPHOS 39  BILITOT 1.2  PROT 6.1*  ALBUMIN 3.1*   No results for input(s): LIPASE, AMYLASE in the last 168 hours. No  results for input(s): AMMONIA in the last 168 hours.  ABG    Component Value Date/Time   PHART 7.407 03/14/2021 1121   PCO2ART 35.8 03/11/2021 1121   PO2ART 131 (H) 03/07/2021 1121   HCO3 23.0 03/16/2021 1121   TCO2 24 03/02/2021 1121   ACIDBASEDEF 2.0 02/18/2021 1121   O2SAT 98.9 02-26-2021 0533     Coagulation Profile: Recent Labs  Lab 03/14/2021 0440  INR 1.3*     Cardiac Enzymes: No results for input(s): CKTOTAL, CKMB, CKMBINDEX, TROPONINI in the last 168 hours.  HbA1C: Hgb A1c MFr Bld  Date/Time Value Ref Range Status  03/05/2021 11:32 AM 6.5 (H) 4.8 - 5.6 % Final    Comment:    (NOTE) Pre  diabetes:          5.7%-6.4%  Diabetes:              >6.4%  Glycemic control for   <7.0% adults with diabetes   09/11/2020 10:00 AM 6.7 (H) 4.8 - 5.6 % Final    Comment:    (NOTE)         Prediabetes: 5.7 - 6.4         Diabetes: >6.4         Glycemic control for adults with diabetes: <7.0     CBG: Recent Labs  Lab 03/08/2021 2029 03/09/2021 2127 03/17/2021 2237 02/23/2021 2351 2021-03-01 0410  GLUCAP 144* 154* 129* 115* 156*    Review of Systems:   Patient is encephalopathic and/or intubated. Therefore history has been obtained from chart review.    Past Medical History:  He,  has a past medical history of Cerebral aneurysm without rupture, Chronic kidney disease, Coronary artery disease, Diabetes mellitus without complication ([place]), Hyperlipidemia, Hypertension, and Myocardial infarction ([place]).   Surgical History:   Past Surgical History:  Procedure Laterality Date   CARDIAC CATHETERIZATION Right 09/24/2015   Procedure: Left Heart Cath and Coronary Angiography;  Surgeon: Dionisio David, MD;  Location: [place] [facility];  Service: Cardiovascular;  Laterality: Right;   CARDIAC CATHETERIZATION N/A 09/24/2015   Procedure: Coronary Stent Intervention;  Surgeon: Yolonda Kida, MD;  Location: [place];  Service: Cardiovascular;  Laterality: N/A;   CORONARY  ANGIOPLASTY     stent placement 6/17   CORONARY/GRAFT ACUTE MI REVASCULARIZATION N/A 03/05/2021   Procedure: Coronary/Graft Acute MI Revascularization;  Surgeon: Leonie Man, MD;  Location: [place];  Service: Cardiovascular;  Laterality: N/A;   LEFT HEART CATH AND CORONARY ANGIOGRAPHY Left 05/26/2018   Procedure: LEFT HEART CATH AND CORONARY ANGIOGRAPHY;  Surgeon: Dionisio David, MD;  Location: [place];  Service: Cardiovascular;  Laterality: Left;   RIGHT/LEFT HEART CATH AND CORONARY ANGIOGRAPHY N/A 03/11/2021   Procedure: RIGHT/LEFT HEART CATH AND CORONARY ANGIOGRAPHY;  Surgeon: Leonie Man, MD;  Location: [facility];  Service: Cardiovascular;  Laterality: N/A;   SHOULDER SURGERY Left      Social History:   reports that he has been smoking cigarettes. He has been smoking an average of .5 packs per day. He has never used smokeless tobacco. He reports current alcohol use of about 1.0 standard drink per week. He reports current drug use. Drug: Marijuana.   Family History:  His family history includes Diabetes Mellitus II in an other family member.   Allergies No Known Allergies   Home Medications  Prior to Admission medications   Medication Sig Start Date End Date Taking? Authorizing Provider  aspirin EC 81 MG EC tablet Take 1 tablet (81 mg total) by mouth daily. 09/25/15   Hower, Aaron Mose, MD  atorvastatin (LIPITOR) 80 MG tablet Take 1 tablet (80 mg total) by mouth daily at 6 PM. 09/25/15   Hower, Aaron Mose, MD  BRILINTA 60 MG TABS tablet Take 60 mg by mouth 2 (two) times daily. 08/06/20   [provider]  carvedilol (COREG) 25 MG tablet Take 25 mg by mouth 2 (two) times daily. 06/17/20   [provider]  enalapril (VASOTEC) 10 MG tablet Take 10 mg by mouth daily. 09/10/20   [provider]  FARXIGA 10 MG TABS tablet Take 10 mg by mouth daily. 09/04/20   [provider]  predniSONE (  DELTASONE) 10 MG tablet Three tabs po  day1; two tabs po day2,3; 1 tab po day4,5 then stop 09/12/20   Loletha Grayer, MD  RYBELSUS 7 MG TABS Take 1 tablet by mouth every morning. 06/13/20   [provider]  Vitamin D, Ergocalciferol, (DRISDOL) 1.25 MG (50000 UNIT) CAPS capsule Take 1 capsule by mouth once a week. 09/06/20   [provider]     Critical care time: 50 minutes necessary due to cardiac arrest, respiration failure, cardiogenic shock      Georgann Housekeeper, AGACNP-BC [facility]  See Amion for personal pager PCCM on call pager [phone] until 7pm. Please call Elink 7p-7a. [phone]  03-18-2021 8:02 AM

## 2021-03-20 NOTE — Progress Notes (Signed)
Patient transported to nuclear med and back without complications. RN at bedside. 

## 2021-03-20 NOTE — Progress Notes (Signed)
  Progress Note   Date: 03/04/2021  Patient Name: Tyler Morales        MRN#: [id number]  Magnesium was given for hypomagnesemia

## 2021-03-20 NOTE — Discharge Summary (Signed)
DEATH SUMMARY   Patient Details  Name: Tyler Morales MRN: [id number] DOB: Mar 29, 1966  Admission/Discharge Information   Admit Date:  2021/03/06  Date of Death: Date of Death: March 07, 2021  Time of Death: Time of Death: 1649-08-15  Length of Stay: 1  Referring Physician: Margaretann Loveless, MD   Reason(s) for Hospitalization  Cardiac arrest due to ventricular fibrillation  Diagnoses  Preliminary cause of death:   Myocardial infarction Secondary Diagnoses (including complications and co-morbidities):  Active Problems:   Cardiac arrest (HCC)   Hypoxemic encephalopathy (HCC)   Respiratory failure [facility])   Brief Hospital Course (including significant findings, care, treatment, and services provided and events leading to death)  Tyler Morales is a 55 y.o. year old male who suffered a ventricular fibrillation cardiac arrest and required prolonged CPR.  He arrived at [facility] was found to have a large anterior STEMI and underwent PCI of a proximal LAD lesion.  He was then transferred to [facility] for potential mechanical cardiac support.  On arrival, he was actually hypertensive but with no response to stimulation and absent brainstem reflexes.  CT scan showed significant cerebral edema indicating severe anoxic injury related to prolonged cardiac arrest.  The next day, the patient underwent nuclear perfusion scanning which showed absent blood flow patient was declared dead by neurological criteria based on this ancillary test and a consistent examination showing no brainstem reflexes and no respiratory drive.  Family declined organ donation.  Pertinent Labs and Studies  Significant Diagnostic Studies CT HEAD WO CONTRAST ( )  Result Date: 03-06-2021 CLINICAL DATA:  Anoxic brain damage. Additional history provided: Post CPR. EXAM: CT HEAD WITHOUT CONTRAST TECHNIQUE: Contiguous axial images were obtained from the base of the skull through the vertex without intravenous contrast. COMPARISON:  Head CT  04/03/2009. FINDINGS: Brain: Diminished gray-white differentiation within the bilateral cerebral hemispheres. Diffuse cerebral sulcal effacement suggesting diffuse cerebral edema. Posterior fossa crowding, likely reflecting cerebellar edema. These findings are compatible with hypoxic/ischemic injury given the provided history. The basal cisterns are largely effaced. Additionally, there is significant effacement of the fourth ventricle without evidence of obstructive hydrocephalus at this time. The cerebellar tonsils are inferiorly displaced, extending up to 6 mm below the level of the foramen magnum. Diffuse cerebral edema results in apparent hyperdensity within the bilateral cerebral sulci, and apparent hyperdensity of the falx/tentorium. No definite acute intracranial hemorrhage. No evidence of an intracranial mass. No midline shift. Vascular: Atherosclerotic calcifications. Skull: Normal. Negative for fracture or focal lesion. Sinuses/Orbits: Visualized orbits show no acute finding. Mild scattered mucosal thickening within the paranasal sinuses. Other: Left mastoid effusion. These results will be called to the ordering clinician or representative by the Radiologist Assistant, and communication documented in the PACS or [employer]. IMPRESSION: Findings compatible with diffuse hypoxic/ischemic injury with cerebral and cerebellar edema, as described. The basal cisterns are largely effaced. Additionally, there is prominent posterior fossa mass effect with significant fourth ventricular effacement (but without evidence of obstructive hydrocephalus at this time). Associated inferior displacement of the cerebellar tonsils, which extend below the level of the foramen magnum by 6 mm. Electronically Signed   By: Jackey Loge D.O.   On: 2021/03/06 16:16   NM Brain W Vasc Flow Min 4V  Result Date: 07-Mar-2021 CLINICAL DATA:  Cardiac arrest. EXAM: NM BRAIN SCAN WITH FLOW - 4+ VIEW TECHNIQUE: Radionuclide angiogram  and static images of the brain were obtained after intravenous injection of radiopharmaceutical. RADIOPHARMACEUTICALS:  21.0 millicuries technetium 99 M Ceretec COMPARISON:  CT brain 03/10/2021. FINDINGS: There is absent perfusion to the cerebral hemispheres on the flow phase portion of the exam. On the delayed planar images there is absent radiotracer accumulation within the cerebral or cerebellar hemispheres. Increased radiotracer accumulation within the nasal region is identified which is felt to represent absent or reduced flow within the internal carotid arteries with increased flow within the external carotid arteries. IMPRESSION: 1. There is absent perfusion to the cerebral and cerebellar hemispheres and absent radiotracer localization within the brain on the delayed images. Findings support the suspected clinical history of brain death. Critical Value/emergent results were called by telephone at the time of interpretation on 03/09/2021 at 1:04 pm to provider [facility] , who verbally acknowledged these results. Electronically Signed   By: Kerby Moors M.D.   On: 03-09-2021 13:04   CARDIAC CATHETERIZATION  Result Date: 02/23/2021   CULPRIT LESTION: Prox LAD to Mid LAD lesion is 100% stenosed.   A drug-eluting stent was successfully placed using a STENT ONYX FRONTIER [id number].   Post intervention, there is a 0% residual stenosis.   Dist LAD lesion is 50% stenosed.   -----------------------------------   Prox Cx to Mid Cx lesion is 40% stenosed.  Dist Cx lesion is 30% stenosed.  2nd Mrg lesion is 80% stenosed.   Mid RCA lesion is 30% stenosed. Dist RCA BMS is 20% stenosed. RPAV lesion is 50% stenosed.   There is moderate to severe left ventricular systolic dysfunction.  The left ventricular ejection fraction is 25-35% by visual estimate.-->  Mid to apical anterior wall hypokinesis.   LV end diastolic pressure is severely elevated.   Hemodynamic findings consistent with mild pulmonary hypertension. SUMMARY  Cardiac Arrest Anterior ST elation MI with mid LAD occlusion 100% ---> Successful DES PCI of mid LAD with a Onyx Frontier 2.75 mm 18 mm 80% mid lateral OM, 50% proximal RCA (codominant) EF 30 to 35% with severely elevated LVEDP of 28 mmHg Right heart cath numbers: RA P 8 mmHg, RVP 34/7/8 mmHg, PAP 33/24/28 mmHg.  PCWP 18 mmHg.  LVP-EDP initially 175/112/136 mmHg once off pressors, follow-up 127/50/80 mmHg Cardiac Output-Index - Fick 4.46-2.48; TD 3.17-1.77 PLAN Convert from Bradner to PO Brilinta _> D/c Cangrelor @ 940 AM Transfer [facility] CVICU - Dr. Aundra Dubin Milrinone infusion --anticipate right IJ Swan upon arrival to [facility]. Glenetta Hew, MD  DG CHEST PORT 1 VIEW  Result Date: 03/01/2021 CLINICAL DATA:  Placement of central venous catheter EXAM: PORTABLE CHEST 1 VIEW COMPARISON:  05/25/2018 FINDINGS: Cardiac size is unremarkable. There are no signs of pulmonary edema. There are new patchy infiltrates in the left lower lung fields. Right lung is clear. Left lateral CP angle is indistinct. There is no pneumothorax. Tip of endotracheal tube is approximately 3.9 cm above the carina. Tip of central venous catheter introduced through the left jugular vein is seen in the region of superior vena cava close to the right atrium. Enteric tube is noted traversing the esophagus. IMPRESSION: There are patchy infiltrates in the left lower lung fields suggesting atelectasis/pneumonia. Electronically Signed   By: Elmer Picker M.D.   On: 03/10/2021 11:18   EEG adult  Result Date: 03/08/2021 Lora Havens, MD     03/18/2021  5:07 PM Patient Name: LELAND POUCH MRN: [id number] Epilepsy Attending: Lora Havens Referring Physician/Provider: Georgann Housekeeper, NP Date: 03/01/2021 Duration: 34.26 mins Patient history: 55yo M s/p cardiac arrest. EEG to evaluate for seizure. Level of alertness:  comatose AEDs during EEG  study: Propofol Technical aspects: This EEG study was done with scalp electrodes positioned  according to the 10-20 International system of electrode placement. Electrical activity was acquired at a sampling rate of 500Hz  and reviewed with a high frequency filter of 70Hz  and a low frequency filter of 1Hz . EEG data were recorded continuously and digitally stored. Description: EEG showed continuous generalized background suppression. EEG was not reactive to tactile stimulation.  Hyperventilation and photic stimulation were not performed.   ABNORMALITY -Background suppression, generalized IMPRESSION: This study is suggestive of profound diffuse encephalopathy, nonspecific etiology but could be secondary to sedation, anoxic/hypoxic brain injury. No seizures or epileptiform discharges were seen throughout the recording. Lora Havens   ECHOCARDIOGRAM COMPLETE  Result Date: 03/17/2021    ECHOCARDIOGRAM REPORT   Patient Name:   Tyler Morales Date of Exam: 02/26/2021 Medical Rec #:  [id number]         Height:       66.0 in Accession #:    [id number]        Weight:       170.0 lb Date of Birth:  07-03-1965         BSA:          1.866 m Patient Age:    55 years          BP:           76/45 mmHg Patient Gender: M                 HR:           46 bpm. Exam Location:  Inpatient Procedure: 2D Echo, Cardiac Doppler, Color Doppler and Intracardiac            Opacification Agent STAT ECHO Indications:    STEMI  History:        Patient has prior history of Echocardiogram examinations, most                 recent 09/24/2015. CAD and Acute MI, Signs/Symptoms:Cardiac                 arrest; Risk Factors:Diabetes and Hypertension.  Sonographer:    Merrie Roof RDCS Referring Phys: [phone] AMY D CLEGG IMPRESSIONS  1. Mid/distal anterior wall apical and septal hypokinesis No LV apical thrombus seen by definity. Left ventricular ejection fraction, by estimation, is 30 to 35%. The left ventricle has moderately decreased function. The left ventricle demonstrates regional wall motion abnormalities (see scoring diagram/findings  for description). Left ventricular diastolic parameters are consistent with Grade I diastolic dysfunction (impaired relaxation).  2. Right ventricular systolic function is normal. The right ventricular size is normal.  3. A small pericardial effusion is present. The pericardial effusion is posterior to the left ventricle and LV apical.  4. The mitral valve is normal in structure. Trivial mitral valve regurgitation. No evidence of mitral stenosis.  5. Calcified non coronary cusp. The aortic valve is tricuspid. Aortic valve regurgitation is not visualized. Mild aortic valve sclerosis is present, with no evidence of aortic valve stenosis.  6. The inferior vena cava is normal in size with greater than 50% respiratory variability, suggesting right atrial pressure of 3 mmHg. FINDINGS  Left Ventricle: Mid/distal anterior wall apical and septal hypokinesis No LV apical thrombus seen by definity. Left ventricular ejection fraction, by estimation, is 30 to 35%. The left ventricle has moderately decreased function. The left ventricle demonstrates regional wall motion abnormalities. Definity contrast agent was given IV to delineate the  left ventricular endocardial borders. The left ventricular internal cavity size was normal in size. There is no left ventricular hypertrophy. Left ventricular diastolic parameters are consistent with Grade I diastolic dysfunction (impaired relaxation). Right Ventricle: The right ventricular size is normal. No increase in right ventricular wall thickness. Right ventricular systolic function is normal. Left Atrium: Left atrial size was normal in size. Right Atrium: Right atrial size was normal in size. Pericardium: A small pericardial effusion is present. The pericardial effusion is posterior to the left ventricle and LV apical. Mitral Valve: The mitral valve is normal in structure. Trivial mitral valve regurgitation. No evidence of mitral valve stenosis. Tricuspid Valve: The tricuspid valve is  normal in structure. Tricuspid valve regurgitation is not demonstrated. No evidence of tricuspid stenosis. Aortic Valve: Calcified non coronary cusp. The aortic valve is tricuspid. Aortic valve regurgitation is not visualized. Mild aortic valve sclerosis is present, with no evidence of aortic valve stenosis. Aortic valve mean gradient measures 4.0 mmHg. Aortic valve peak gradient measures 6.0 mmHg. Aortic valve area, by VTI measures 3.06 cm. Pulmonic Valve: The pulmonic valve was normal in structure. Pulmonic valve regurgitation is not visualized. No evidence of pulmonic stenosis. Aorta: The aortic root is normal in size and structure. Venous: The inferior vena cava is normal in size with greater than 50% respiratory variability, suggesting right atrial pressure of 3 mmHg. IAS/Shunts: No atrial level shunt detected by color flow Doppler.  LEFT VENTRICLE PLAX 2D LVIDd:         3.93 cm   Diastology LVIDs:         2.62 cm   LV e' medial:    5.00 cm/s LV PW:         1.08 cm   LV E/e' medial:  7.4 LV IVS:        1.01 cm   LV e' lateral:   4.35 cm/s LVOT diam:     2.20 cm   LV E/e' lateral: 8.5 LV SV:         55 LV SV Index:   29 LVOT Area:     3.80 cm  LEFT ATRIUM             Index        RIGHT ATRIUM           Index LA diam:        3.20 cm 1.71 cm/m   RA Area:     17.20 cm 9.22 cm/m LA Vol (A2C):   50.9 ml 27.27 ml/m LA Vol (A4C):   55.5 ml 29.74 ml/m LA Biplane Vol: 53.8 ml 28.83 ml/m  AORTIC VALVE AV Area (Vmax):    2.83 cm AV Area (Vmean):   2.44 cm AV Area (VTI):     3.06 cm AV Vmax:           122.00 cm/s AV Vmean:          89.900 cm/s AV VTI:            0.179 m AV Peak Grad:      6.0 mmHg AV Mean Grad:      4.0 mmHg LVOT Vmax:         90.70 cm/s LVOT Vmean:        57.600 cm/s LVOT VTI:          0.144 m LVOT/AV VTI ratio: 0.80  AORTA Ao Root diam: 3.00 cm MITRAL VALVE MV Area (PHT): 3.46 cm    SHUNTS MV Decel Time: 219  msec    Systemic VTI:  0.14 m MV E velocity: 36.80 cm/s  Systemic Diam: 2.20 cm MV A  velocity: 70.70 cm/s MV E/A ratio:  0.52 Jenkins Rouge MD Electronically signed by Jenkins Rouge MD Signature Date/Time: 03/14/2021/2:37:01 PM    Final     Microbiology Recent Results (from the past 240 hour(s))  Resp Panel by RT-PCR (Flu A&B, Covid) Nasopharyngeal Swab     Status: None   Collection Time: 02/22/2021  3:45 AM   Specimen: Nasopharyngeal Swab; Nasopharyngeal(NP) swabs in vial transport medium  Result Value Ref Range Status   SARS Coronavirus 2 by RT PCR NEGATIVE NEGATIVE Final    Comment: (NOTE) SARS-CoV-2 target nucleic acids are NOT DETECTED.  The SARS-CoV-2 RNA is generally detectable in upper respiratory specimens during the acute phase of infection. The lowest concentration of SARS-CoV-2 viral copies this assay can detect is 138 copies/mL. A negative result does not preclude SARS-Cov-2 infection and should not be used as the sole basis for treatment or other patient management decisions. A negative result may occur with  improper specimen collection/handling, submission of specimen other than nasopharyngeal swab, presence of viral mutation(s) within the areas targeted by this assay, and inadequate number of viral copies(<138 copies/mL). A negative result must be combined with clinical observations, patient history, and epidemiological information. The expected result is Negative.  Fact Sheet for Patients:  EntrepreneurPulse.com.au  Fact Sheet for Healthcare Providers:  IncredibleEmployment.be  This test is no t yet approved or cleared by the [place] FDA and  has been authorized for detection and/or diagnosis of SARS-CoV-2 by FDA under an Emergency Use Authorization (EUA). This EUA will remain  in effect (meaning this test can be used) for the duration of the COVID-19 declaration under Section 564(b)(1) of the Act, 21 U.S.C.section 360bbb-3(b)(1), unless the authorization is terminated  or revoked sooner.       Influenza A  by PCR NEGATIVE NEGATIVE Final   Influenza B by PCR NEGATIVE NEGATIVE Final    Comment: (NOTE) The Xpert Xpress SARS-CoV-2/FLU/RSV plus assay is intended as an aid in the diagnosis of influenza from Nasopharyngeal swab specimens and should not be used as a sole basis for treatment. Nasal washings and aspirates are unacceptable for Xpert Xpress SARS-CoV-2/FLU/RSV testing.  Fact Sheet for Patients: EntrepreneurPulse.com.au  Fact Sheet for Healthcare Providers: IncredibleEmployment.be  This test is not yet approved or cleared by the [place] FDA and has been authorized for detection and/or diagnosis of SARS-CoV-2 by FDA under an Emergency Use Authorization (EUA). This EUA will remain in effect (meaning this test can be used) for the duration of the COVID-19 declaration under Section 564(b)(1) of the Act, 21 U.S.C. section 360bbb-3(b)(1), unless the authorization is terminated or revoked.  Performed at [facility], [address] [place], [place] [address]   MRSA Next Gen by PCR, Nasal     Status: Abnormal   Collection Time: 03/19/2021 10:37 AM   Specimen: Nasal Mucosa; Nasal Swab  Result Value Ref Range Status   MRSA by PCR Next Gen DETECTED (A) NOT DETECTED Final    Comment: RESULT CALLED TO, READ BACK BY AND VERIFIED WITH: A CAIN RN [id number] 03/04/2021 A BROWNING (NOTE) The GeneXpert MRSA Assay (FDA approved for NASAL specimens only), is one component of a comprehensive MRSA colonization surveillance program. It is not intended to diagnose MRSA infection nor to guide or monitor treatment for MRSA infections. Test performance is not FDA approved in patients less than 76 years old.  Performed at [facility], [address] [place], [place] [address]     Lab Basic Metabolic Panel: Recent Labs  Lab 2021/03/11 0440 03/11/2021 1121 Mar 11, 2021 1132 03/01/2021 0533 03/16/2021 0553  NA 139 137 135 137  --   K 4.6 4.1 4.2 3.3*  --    CL 101  --  103 105  --   CO2 26  --  19* 22  --   GLUCOSE 383*  --  368* 181*  --   BUN 22*  --  29* 24*  --   CREATININE 1.62*  --  1.66* 1.39*  --   CALCIUM 7.9*  --  7.7* 7.9*  --   MG 2.2  --   --   --  1.6*  PHOS  --   --   --   --  3.7   Liver Function Tests: Recent Labs  Lab 03-11-21 1132  AST 414*  ALT 177*  ALKPHOS 39  BILITOT 1.2  PROT 6.1*  ALBUMIN 3.1*   No results for input(s): LIPASE, AMYLASE in the last 168 hours. No results for input(s): AMMONIA in the last 168 hours. CBC: Recent Labs  Lab 03-11-21 0440 03-11-2021 1121 2021-03-11 1132 03/10/2021 0533  WBC 14.5*  --  20.2* 11.6*  NEUTROABS 7.2  --   --   --   HGB 12.5* 13.9 13.8 12.1*  HCT 37.7* 41.0 39.3 34.1*  MCV 94.3  --  88.7 87.9  PLT 214  --  305 240   Cardiac Enzymes: No results for input(s): CKTOTAL, CKMB, CKMBINDEX, TROPONINI in the last 168 hours. Sepsis Labs: Recent Labs  Lab 2021-03-11 0440 03/11/21 1132 11-Mar-2021 1222 2021-03-11 1726 02/20/2021 0533  WBC 14.5* 20.2*  --   --  11.6*  LATICACIDVEN  --   --  2.6* 3.3*  --     Procedures/Operations  Percutaneous coronary intervention, mechanical ventilation.   Quintell Bonnin 02/27/2021, 5:53 PM

## 2021-03-20 NOTE — Progress Notes (Signed)
   07-Mar-2021 1015  Clinical Encounter Type  Visited With Family  Visit Type Initial;Spiritual support;Social support;Psychological support;Critical Care  Referral From Nurse  Consult/Referral To Chaplain  Spiritual Encounters  Spiritual Needs Emotional;Grief support  Stress Factors  Family Stress Factors Loss of control;Major life changes;Health changes   CH received page from [facility] secretary to provide support to pt.'s family in [facility] waiting area.  When [facility] arrived there were several family members present, including pt.'s brother and sisters, two nieces, wife, and brother-in-law.  Family shared that pt. suffered a heart attack yesterday; family say medical team have just told them that pt. will be undergoing testing later today to determine brain function.  Family distraught and grieving; CH provided supportive presence and made family aware of chaplains' availability throughout the day.  Spiritual care team made aware of situation for possible follow-up if needed/requested.  Elpidio Anis, Chaplain Pager: [phone]

## 2021-03-20 NOTE — Progress Notes (Signed)
  Progress Note   Date: 03/04/2021  Patient Name: Tyler Morales        MRN#: [id number]  Clarification of diagnosis:  CKD Stage I

## 2021-03-20 NOTE — Progress Notes (Signed)
Patient ID: Tyler Morales, male   DOB: [birth year], 55 y.o.   MRN: [id number]     Advanced Heart Failure Rounding Note  PCP-Cardiologist: None   Subjective:    No response, remains on Fentanyl gtt.   MAP labile, elevated today on epinephrine 10, NE 24, vasopressin 0.03, dobutamine 2.5.   On amiodarone gtt 60 mg/hr in NSR.   Creatinine stable 1.34  CT head: Findings consistent with hypoxic/ischemic injury  EEG: Profound diffuse encephalopathy  Echo: EF 30-35% with LAD territory WMAs, no thrombus, normal RV, normal IVC  Objective:   Weight Range: 77.1 kg Body mass index is 27.44 kg/m.   Vital Signs:   Temp:  [93 F (33.9 C)-97 F (36.1 C)] 97 F (36.1 C) (11/08 0415) Pulse Rate:  [46-123] 84 (11/08 0415) Resp:  [14-20] 20 (11/08 0415) BP: (78-164)/(59-118) 159/108 (11/08 0415) SpO2:  [98 %-100 %] 99 % (11/08 0425) Arterial Line BP: (71-181)/(49-113) 167/95 (11/08 0415) FiO2 (%):  [40 %-60 %] 40 % (11/08 0425) Weight:  [77.1 kg] 77.1 kg (11/07 0930) Last BM Date:  (pta)  Weight change: Filed Weights   02/18/2021 0930  Weight: 77.1 kg    Intake/Output:   Intake/Output Summary (Last 24 hours) at 17-Mar-2021 0712 Last data filed at March 17, 2021 0200 Gross per 24 hour  Intake 2816.72 ml  Output 1080 ml  Net 1736.72 ml      Physical Exam    General:  NAD HEENT: Normal Neck: Supple. JVP not elevated. Carotids 2+ bilat; no bruits. No lymphadenopathy or thyromegaly appreciated. Cor: PMI nondisplaced. Regular rate & rhythm. No rubs, gallops or murmurs. Lungs: Clear Abdomen: Soft, nontender, nondistended. No hepatosplenomegaly. No bruits or masses. Good bowel sounds. Extremities: No cyanosis, clubbing, rash, edema Neuro: Nonresponsive   Telemetry   NSR 80s (personally reviewed)   Labs    CBC Recent Labs    02/23/2021 0440 03/10/2021 1121 03/09/2021 1132 03-17-21 0533  WBC 14.5*  --  20.2* 11.6*  NEUTROABS 7.2  --   --   --   HGB 12.5*   < > 13.8 12.1*   HCT 37.7*   < > 39.3 34.1*  MCV 94.3  --  88.7 87.9  PLT 214  --  305 240   < > = values in this interval not displayed.   Basic Metabolic Panel Recent Labs    02/28/2021 0440 03/09/2021 1121 02/26/2021 1132 03/17/2021 0533  NA 139   < > 135 137  K 4.6   < > 4.2 3.3*  CL 101  --  103 105  CO2 26  --  19* 22  GLUCOSE 383*  --  368* 181*  BUN 22*  --  29* 24*  CREATININE 1.62*  --  1.66* 1.39*  CALCIUM 7.9*  --  7.7* 7.9*  MG 2.2  --   --   --    < > = values in this interval not displayed.   Liver Function Tests Recent Labs    02/28/2021 1132  AST 414*  ALT 177*  ALKPHOS 39  BILITOT 1.2  PROT 6.1*  ALBUMIN 3.1*   No results for input(s): LIPASE, AMYLASE in the last 72 hours. Cardiac Enzymes No results for input(s): CKTOTAL, CKMB, CKMBINDEX, TROPONINI in the last 72 hours.  BNP: BNP (last 3 results) Recent Labs    03/09/2021 1132  BNP 122.1*    ProBNP (last 3 results) No results for input(s): PROBNP in the last 8760 hours.   D-Dimer No results  for input(s): DDIMER in the last 72 hours. Hemoglobin A1C Recent Labs    02/22/2021 1132  HGBA1C 6.5*   Fasting Lipid Panel Recent Labs    03/01/2021 1132 15-Mar-2021 0533  CHOL 109  --   HDL 31*  --   LDLCALC 62  --   TRIG 78 99  CHOLHDL 3.5  --    Thyroid Function Tests No results for input(s): TSH, T4TOTAL, T3FREE, THYROIDAB in the last 72 hours.  Invalid input(s): FREET3  Other results:   Imaging    CT HEAD WO CONTRAST (5MM)  Result Date: 03/18/2021 CLINICAL DATA:  Anoxic brain damage. Additional history provided: Post CPR. EXAM: CT HEAD WITHOUT CONTRAST TECHNIQUE: Contiguous axial images were obtained from the base of the skull through the vertex without intravenous contrast. COMPARISON:  Head CT 04/03/2009. FINDINGS: Brain: Diminished gray-white differentiation within the bilateral cerebral hemispheres. Diffuse cerebral sulcal effacement suggesting diffuse cerebral edema. Posterior fossa crowding, likely  reflecting cerebellar edema. These findings are compatible with hypoxic/ischemic injury given the provided history. The basal cisterns are largely effaced. Additionally, there is significant effacement of the fourth ventricle without evidence of obstructive hydrocephalus at this time. The cerebellar tonsils are inferiorly displaced, extending up to 6 mm below the level of the foramen magnum. Diffuse cerebral edema results in apparent hyperdensity within the bilateral cerebral sulci, and apparent hyperdensity of the falx/tentorium. No definite acute intracranial hemorrhage. No evidence of an intracranial mass. No midline shift. Vascular: Atherosclerotic calcifications. Skull: Normal. Negative for fracture or focal lesion. Sinuses/Orbits: Visualized orbits show no acute finding. Mild scattered mucosal thickening within the paranasal sinuses. Other: Left mastoid effusion. These results will be called to the ordering clinician or representative by the Radiologist Assistant, and communication documented in the PACS or [employer]. IMPRESSION: Findings compatible with diffuse hypoxic/ischemic injury with cerebral and cerebellar edema, as described. The basal cisterns are largely effaced. Additionally, there is prominent posterior fossa mass effect with significant fourth ventricular effacement (but without evidence of obstructive hydrocephalus at this time). Associated inferior displacement of the cerebellar tonsils, which extend below the level of the foramen magnum by 6 mm. Electronically Signed   By: Kellie Simmering D.O.   On: 03/16/2021 16:16   DG CHEST PORT 1 VIEW  Result Date: 03/06/2021 CLINICAL DATA:  Placement of central venous catheter EXAM: PORTABLE CHEST 1 VIEW COMPARISON:  05/25/2018 FINDINGS: Cardiac size is unremarkable. There are no signs of pulmonary edema. There are new patchy infiltrates in the left lower lung fields. Right lung is clear. Left lateral CP angle is indistinct. There is no  pneumothorax. Tip of endotracheal tube is approximately 3.9 cm above the carina. Tip of central venous catheter introduced through the left jugular vein is seen in the region of superior vena cava close to the right atrium. Enteric tube is noted traversing the esophagus. IMPRESSION: There are patchy infiltrates in the left lower lung fields suggesting atelectasis/pneumonia. Electronically Signed   By: Elmer Picker M.D.   On: 03/15/2021 11:18   EEG adult  Result Date: 03/04/2021 Lora Havens, MD     03/14/2021  5:07 PM Patient Name: Tyler Morales MRN: [id number] Epilepsy Attending: Lora Havens Referring Physician/Provider: Georgann Housekeeper, NP Date: 03/18/2021 Duration: 34.26 mins Patient history: 55yo M s/p cardiac arrest. EEG to evaluate for seizure. Level of alertness:  comatose AEDs during EEG study: Propofol Technical aspects: This EEG study was done with scalp electrodes positioned according to the 10-20 International system of electrode placement. Dealer  activity was acquired at a sampling rate of _0  and reviewed with a high frequency filter of _1  and a low frequency filter of _2 . EEG data were recorded continuously and digitally stored. Description: EEG showed continuous generalized background suppression. EEG was not reactive to tactile stimulation.  Hyperventilation and photic stimulation were not performed.   ABNORMALITY -Background suppression, generalized IMPRESSION: This study is suggestive of profound diffuse encephalopathy, nonspecific etiology but could be secondary to sedation, anoxic/hypoxic brain injury. No seizures or epileptiform discharges were seen throughout the recording. Lora Havens   ECHOCARDIOGRAM COMPLETE  Result Date: 03/08/2021    ECHOCARDIOGRAM REPORT   Patient Name:   Tyler Morales Date of Exam: 03/18/2021 Medical Rec #:  [id number]         Height:       66.0 in Accession #:    [id number]        Weight:       170.0 lb Date of Birth:  Oct 30, 1965          BSA:          1.866 m Patient Age:    43 years          BP:           76/45 mmHg Patient Gender: M                 HR:           46 bpm. Exam Location:  Inpatient Procedure: 2D Echo, Cardiac Doppler, Color Doppler and Intracardiac            Opacification Agent STAT ECHO Indications:    STEMI  History:        Patient has prior history of Echocardiogram examinations, most                 recent 09/24/2015. CAD and Acute MI, Signs/Symptoms:Cardiac                 arrest; Risk Factors:Diabetes and Hypertension.  Sonographer:    Merrie Roof RDCS Referring Phys: [id number] AMY D CLEGG IMPRESSIONS  1. Mid/distal anterior wall apical and septal hypokinesis No LV apical thrombus seen by definity. Left ventricular ejection fraction, by estimation, is 30 to 35%. The left ventricle has moderately decreased function. The left ventricle demonstrates regional wall motion abnormalities (see scoring diagram/findings for description). Left ventricular diastolic parameters are consistent with Grade I diastolic dysfunction (impaired relaxation).  2. Right ventricular systolic function is normal. The right ventricular size is normal.  3. A small pericardial effusion is present. The pericardial effusion is posterior to the left ventricle and LV apical.  4. The mitral valve is normal in structure. Trivial mitral valve regurgitation. No evidence of mitral stenosis.  5. Calcified non coronary cusp. The aortic valve is tricuspid. Aortic valve regurgitation is not visualized. Mild aortic valve sclerosis is present, with no evidence of aortic valve stenosis.  6. The inferior vena cava is normal in size with greater than 50% respiratory variability, suggesting right atrial pressure of 3 mmHg. FINDINGS  Left Ventricle: Mid/distal anterior wall apical and septal hypokinesis No LV apical thrombus seen by definity. Left ventricular ejection fraction, by estimation, is 30 to 35%. The left ventricle has moderately decreased function. The left  ventricle demonstrates regional wall motion abnormalities. Definity contrast agent was given IV to delineate the left ventricular endocardial borders. The left ventricular internal cavity size was normal in size. There is no left ventricular hypertrophy. Left ventricular diastolic  parameters are consistent with Grade I diastolic dysfunction (impaired relaxation). Right Ventricle: The right ventricular size is normal. No increase in right ventricular wall thickness. Right ventricular systolic function is normal. Left Atrium: Left atrial size was normal in size. Right Atrium: Right atrial size was normal in size. Pericardium: A small pericardial effusion is present. The pericardial effusion is posterior to the left ventricle and LV apical. Mitral Valve: The mitral valve is normal in structure. Trivial mitral valve regurgitation. No evidence of mitral valve stenosis. Tricuspid Valve: The tricuspid valve is normal in structure. Tricuspid valve regurgitation is not demonstrated. No evidence of tricuspid stenosis. Aortic Valve: Calcified non coronary cusp. The aortic valve is tricuspid. Aortic valve regurgitation is not visualized. Mild aortic valve sclerosis is present, with no evidence of aortic valve stenosis. Aortic valve mean gradient measures 4.0 mmHg. Aortic valve peak gradient measures 6.0 mmHg. Aortic valve area, by VTI measures 3.06 cm. Pulmonic Valve: The pulmonic valve was normal in structure. Pulmonic valve regurgitation is not visualized. No evidence of pulmonic stenosis. Aorta: The aortic root is normal in size and structure. Venous: The inferior vena cava is normal in size with greater than 50% respiratory variability, suggesting right atrial pressure of 3 mmHg. IAS/Shunts: No atrial level shunt detected by color flow Doppler.  LEFT VENTRICLE PLAX 2D LVIDd:         3.93 cm   Diastology LVIDs:         2.62 cm   LV e' medial:    5.00 cm/s LV PW:         1.08 cm   LV E/e' medial:  7.4 LV IVS:        1.01 cm    LV e' lateral:   4.35 cm/s LVOT diam:     2.20 cm   LV E/e' lateral: 8.5 LV SV:         55 LV SV Index:   29 LVOT Area:     3.80 cm  LEFT ATRIUM             Index        RIGHT ATRIUM           Index LA diam:        3.20 cm 1.71 cm/m   RA Area:     17.20 cm 9.22 cm/m LA Vol (A2C):   50.9 ml 27.27 ml/m LA Vol (A4C):   55.5 ml 29.74 ml/m LA Biplane Vol: 53.8 ml 28.83 ml/m  AORTIC VALVE AV Area (Vmax):    2.83 cm AV Area (Vmean):   2.44 cm AV Area (VTI):     3.06 cm AV Vmax:           122.00 cm/s AV Vmean:          89.900 cm/s AV VTI:            0.179 m AV Peak Grad:      6.0 mmHg AV Mean Grad:      4.0 mmHg LVOT Vmax:         90.70 cm/s LVOT Vmean:        57.600 cm/s LVOT VTI:          0.144 m LVOT/AV VTI ratio: 0.80  AORTA Ao Root diam: 3.00 cm MITRAL VALVE MV Area (PHT): 3.46 cm    SHUNTS MV Decel Time: 219 msec    Systemic VTI:  0.14 m MV E velocity: 36.80 cm/s  Systemic Diam: 2.20 cm MV A velocity: 70.70  cm/s MV E/A ratio:  0.52 Jenkins Rouge MD Electronically signed by Jenkins Rouge MD Signature Date/Time: 03/09/2021/2:37:01 PM    Final      Medications:     Scheduled Medications:  amiodarone  150 mg Intravenous Once   aspirin  81 mg Per Tube Daily   atorvastatin  80 mg Per Tube Daily   chlorhexidine gluconate (MEDLINE KIT)  15 mL Mouth Rinse BID   Chlorhexidine Gluconate Cloth  6 each Topical Daily   Chlorhexidine Gluconate Cloth  6 each Topical Daily   Chlorhexidine Gluconate Cloth  6 each Topical Q0600   docusate  100 mg Per Tube BID   fentaNYL (SUBLIMAZE) injection  50 mcg Intravenous Once   insulin aspart  2-6 Units Subcutaneous Q4H   insulin detemir  12 Units Subcutaneous Q12H   mouth rinse  15 mL Mouth Rinse 10 times per day   mupirocin ointment  1 application Nasal BID   pantoprazole (PROTONIX) IV  40 mg Intravenous Daily   polyethylene glycol  17 g Per Tube Daily   sodium chloride flush  10-40 mL Intracatheter Q12H   ticagrelor  90 mg Per Tube BID    Infusions:  sodium  chloride Stopped (03/16/2021 1237)   amiodarone 60 mg/hr (03-01-2021 0200)   DOBUTamine 2.5 mcg/kg/min (03-01-21 0100)   epinephrine 10 mcg/min (2021-03-01 0200)   fentaNYL infusion INTRAVENOUS 75 mcg/hr (2021/03/01 0200)   nitroGLYCERIN Stopped (03/16/2021 1151)   norepinephrine (LEVOPHED) Adult infusion 24 mcg/min (2021-03-01 0200)   propofol (DIPRIVAN) infusion Stopped (02/26/2021 1216)   vasopressin 0.03 Units/min (2021/03/01 0200)    PRN Medications: sodium chloride, fentaNYL, sodium chloride flush    Assessment/Plan   1. CAD: History of CAD with RCA PCI.  Admitted with anterior STEMI, now s/p DES to mLAD.  Has residual 80% PLOM stenosis.   - Continue ASA 81 and ticagrelor 90 bid.  - Atorvastatin 80 daily.  - Remove Impella sheath.  2. Acute systolic CHF: Ischemic cardiomyopathy.  Echo with EF 30-35% range with LAD wall motion abnormalities, RV function preserved, no obvious mechanical complication of MI.  Labile MAP, now high.  Co-ox not accurate this morning but CI 2.5 on FloTrac.  Concern for cardiogenic shock, remains on epinephrine 10, NE 24, vasopressin 0.03, dobutamine 2.5.  Mechanical support not placed due to concern for anoxic encephalopathy. CVP 7.  - Resend co-ox.  - Continue dobutamine 2.5.  - Wean down vasopressin first, then epinephrine.   - CVP 7, will not give Lasix yet.   3. AKI: Creatinine trending down, 1.39 today.  4. Acute hypoxemic respiratory failure: Intubated in setting of cardiac arrest. - CCM following.  - CXR.  5. Cardiac arrest: Initially shockable rhythm, likely VF.   Had PEA arrest in hospital, treated with epinephrine.  Has had NSVT runs. Uncertain downtime, now concerned for anoxic encephalopathy.  - Decrease amiodarone gtt to 30.  6. Type 2 DM:  - Cover with insulin 7. Left chest wall hematoma: From CPR most likely. Hgb stable.  - Use binder.  8. Neuro: Suspect anoxic encephalopathy from head CT and EEG.  Discussed with family this morning.  - Stop  Fentanyl, follow neuro exam.  - Palliative care consultation.  - He is DNR.   CRITICAL CARE Performed by: Loralie Champagne  Total critical care time: 40 minutes  Critical care time was exclusive of separately billable procedures and treating other patients.  Critical care was necessary to treat or prevent imminent or life-threatening deterioration.  Critical  care was time spent personally by me on the following activities: development of treatment plan with patient and/or surrogate as well as nursing, discussions with consultants, evaluation of patient's response to treatment, examination of patient, obtaining history from patient or surrogate, ordering and performing treatments and interventions, ordering and review of laboratory studies, ordering and review of radiographic studies, pulse oximetry and re-evaluation of patient's condition.   Length of Stay: 1  Loralie Champagne, MD  2021/03/20, 7:12 AM  Advanced Heart Failure Team Pager [phone] (M-F; 7a - 5p)  Please contact [facility] for night-coverage after hours (5p -7a ) and weekends on [email or web address]

## 2021-03-20 NOTE — Progress Notes (Signed)
Impella peelaway sheath aspirated and removed from RFA. Manual pressure applied for 45 minutes. Site level 0, no S+S of hematoma. Tegaderm dressing applied, patient intubated and unresponsive.   Right pt pulsed present with doppler, right dp absent. Left pt and dp pulses present with doppler.   Bedrest begins at 11:00:00am

## 2021-03-20 NOTE — Progress Notes (Signed)
  Progress Note   Date: 03/04/2021  Patient Name: Tyler Morales        MRN#: [id number]  Potassium was given for hypokalemia

## 2021-03-20 DEATH — deceased

## 2023-03-01 IMAGING — NM NM BRAIN 4+V W/ FLOW
7 series · 12 of 12 positions shown · non-contrast
Comparison: CT brain 02/24/2021.

CLINICAL DATA: Cardiac arrest.

EXAM:
NM BRAIN SCAN WITH FLOW - 4+ VIEW
TECHNIQUE: Radionuclide angiogram and static images of the brain were obtained
after intravenous injection of radiopharmaceutical.
RADIOPHARMACEUTICALS:  21.0 millicuries technetium 99 M Cere[employer]

[Series 1: flow · 2.07mm/px · 6 of 28 frames shown]
[frame 3/28]
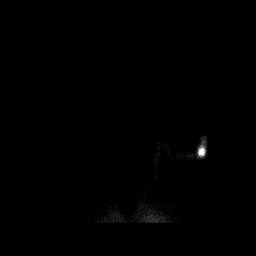
[frame 7/28  full-range]
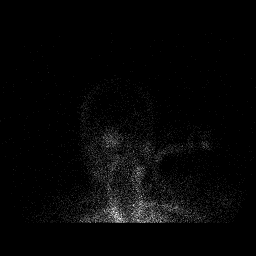
[frame 12/28  full-range]
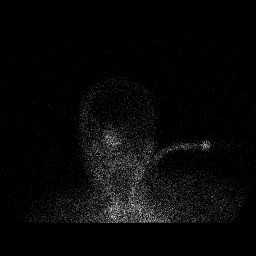
[frame 17/28  full-range]
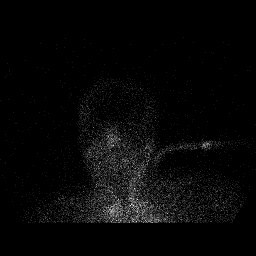
[frame 21/28  full-range]
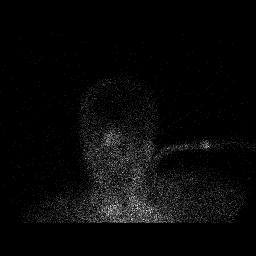
[frame 26/28  full-range]
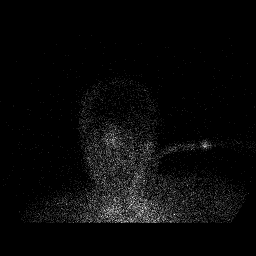

[Series 2: ant/post · 4.14mm/px · 1 of 1 slices shown (1 of 2)]
[im 1/1]
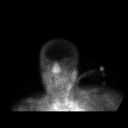

[Series 3: lt lat/rt lat · 4.14mm/px · 1 of 1 slices shown (1 of 4)]
[im 1/1]
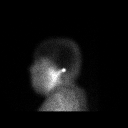

[Series 3: lt lat/rt lat · 4.14mm/px · 1 of 1 slices shown (2 of 4)]
[im 1/1]
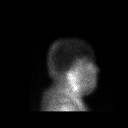

[Series 4: ant/post · 4.14mm/px · 1 of 1 slices shown (2 of 2)]
[im 1/1]
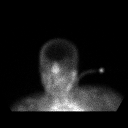

[Series 5: lt lat/rt lat · 4.14mm/px · 1 of 1 slices shown (3 of 4)]
[im 1/1]
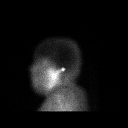

[Series 5: lt lat/rt lat · 4.14mm/px · 1 of 1 slices shown (4 of 4)]
[im 1/1]
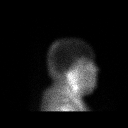

[12 of 12 positions shown; findings below may reference images not displayed]

FINDINGS: There is absent perfusion to the cerebral hemispheres on the flow
phase portion of the exam. On the delayed planar images there is
absent radiotracer accumulation within the cerebral or cerebellar
hemispheres. Increased radiotracer accumulation within the nasal
region is identified which is felt to represent absent or reduced
flow within the internal carotid arteries with increased flow within
the external carotid arteries.
IMPRESSION: 1. There is absent perfusion to the cerebral and cerebellar
hemispheres and absent radiotracer localization within the brain on
the delayed images. Findings support the suspected clinical history
of brain death.

Critical Value/emergent results were called by telephone at the time
of interpretation on 02/25/2021 at [DATE] to provider JHAY-R ONG ,
who verbally acknowledged these results.
